# Patient Record
Sex: Male | Born: 1965
Health system: Southern US, Community
[De-identification: ages and names within clinical notes are randomized; demographics above are authoritative.]

## PROBLEM LIST (undated history)

## (undated) DIAGNOSIS — Z923 Personal history of irradiation: Secondary | ICD-10-CM

## (undated) DIAGNOSIS — D473 Essential (hemorrhagic) thrombocythemia: Secondary | ICD-10-CM

## (undated) DIAGNOSIS — D75839 Thrombocytosis, unspecified: Secondary | ICD-10-CM

## (undated) DIAGNOSIS — N529 Male erectile dysfunction, unspecified: Secondary | ICD-10-CM

## (undated) DIAGNOSIS — C61 Malignant neoplasm of prostate: Secondary | ICD-10-CM

## (undated) DIAGNOSIS — I10 Essential (primary) hypertension: Secondary | ICD-10-CM

## (undated) HISTORY — PX: COLONOSCOPY: SHX174

---

## 2007-03-13 ENCOUNTER — Ambulatory Visit: Payer: Self-pay | Admitting: Oncology

## 2007-03-13 LAB — CBC WITH DIFFERENTIAL/PLATELET
BASO%: 0.2 % (ref 0.0–2.0)
Eosinophils Absolute: 0.1 10*3/uL (ref 0.0–0.5)
MCHC: 35.8 g/dL (ref 32.0–35.9)
MCV: 86.1 fL (ref 81.6–98.0)
MONO#: 0.4 10*3/uL (ref 0.1–0.9)
MONO%: 5.1 % (ref 0.0–13.0)
NEUT#: 5.2 10*3/uL (ref 1.5–6.5)
RBC: 5.27 10*6/uL (ref 4.20–5.71)
RDW: 13.2 % (ref 11.2–14.6)
WBC: 7.1 10*3/uL (ref 4.0–10.0)

## 2007-03-13 LAB — ERYTHROCYTE SEDIMENTATION RATE: Sed Rate: 1 mm/hr (ref 0–20)

## 2007-03-13 LAB — CHCC SMEAR

## 2007-03-13 LAB — PROTHROMBIN TIME
INR: 1.1 (ref 0.0–1.5)
Prothrombin Time: 14.5 seconds (ref 11.6–15.2)

## 2007-03-13 LAB — APTT: aPTT: 33 seconds (ref 24–37)

## 2007-03-13 LAB — MORPHOLOGY: PLT EST: INCREASED

## 2007-03-15 ENCOUNTER — Encounter: Payer: Self-pay | Admitting: Oncology

## 2007-03-15 ENCOUNTER — Ambulatory Visit: Payer: Self-pay | Admitting: Oncology

## 2007-03-15 ENCOUNTER — Ambulatory Visit (HOSPITAL_COMMUNITY): Admission: RE | Admit: 2007-03-15 | Discharge: 2007-03-15 | Payer: Self-pay | Admitting: Oncology

## 2007-03-18 LAB — COMPREHENSIVE METABOLIC PANEL
ALT: 23 U/L (ref 0–53)
AST: 19 U/L (ref 0–37)
Alkaline Phosphatase: 32 U/L — ABNORMAL LOW (ref 39–117)
Creatinine, Ser: 1.06 mg/dL (ref 0.40–1.50)
Sodium: 142 mEq/L (ref 135–145)
Total Bilirubin: 0.5 mg/dL (ref 0.3–1.2)
Total Protein: 7 g/dL (ref 6.0–8.3)

## 2007-03-18 LAB — CBC WITH DIFFERENTIAL/PLATELET
Basophils Absolute: 0 10*3/uL (ref 0.0–0.1)
EOS%: 2.3 % (ref 0.0–7.0)
Eosinophils Absolute: 0.1 10*3/uL (ref 0.0–0.5)
HCT: 44 % (ref 38.7–49.9)
HGB: 15.8 g/dL (ref 13.0–17.1)
MCH: 30.4 pg (ref 28.0–33.4)
MCV: 85 fL (ref 81.6–98.0)
MONO%: 5.4 % (ref 0.0–13.0)
NEUT%: 68.5 % (ref 40.0–75.0)
lymph#: 1.4 10*3/uL (ref 0.9–3.3)

## 2007-03-18 LAB — JAK2 GENOTYPR

## 2007-03-18 LAB — LACTATE DEHYDROGENASE: LDH: 161 U/L (ref 94–250)

## 2007-03-18 LAB — C-REACTIVE PROTEIN: CRP: 0 mg/dL (ref <0.6)

## 2007-03-21 LAB — CBC WITH DIFFERENTIAL/PLATELET
Basophils Absolute: 0 10*3/uL (ref 0.0–0.1)
EOS%: 1.8 % (ref 0.0–7.0)
HGB: 16.1 g/dL (ref 13.0–17.1)
LYMPH%: 27.4 % (ref 14.0–48.0)
MCH: 30.8 pg (ref 28.0–33.4)
MCV: 86.5 fL (ref 81.6–98.0)
MONO%: 5.3 % (ref 0.0–13.0)
Platelets: 834 10*3/uL — ABNORMAL HIGH (ref 145–400)
RDW: 12.9 % (ref 11.2–14.6)

## 2007-03-26 LAB — CBC WITH DIFFERENTIAL/PLATELET
BASO%: 0.5 % (ref 0.0–2.0)
EOS%: 1.7 % (ref 0.0–7.0)
Eosinophils Absolute: 0.1 10*3/uL (ref 0.0–0.5)
LYMPH%: 22.7 % (ref 14.0–48.0)
MCH: 30.9 pg (ref 28.0–33.4)
MCHC: 35.5 g/dL (ref 32.0–35.9)
MCV: 87 fL (ref 81.6–98.0)
MONO%: 5.3 % (ref 0.0–13.0)
NEUT#: 4 10*3/uL (ref 1.5–6.5)
Platelets: 725 10*3/uL — ABNORMAL HIGH (ref 145–400)
RBC: 5.29 10*6/uL (ref 4.20–5.71)
RDW: 12.7 % (ref 11.2–14.6)

## 2007-04-02 LAB — CBC WITH DIFFERENTIAL/PLATELET
BASO%: 0.4 % (ref 0.0–2.0)
Eosinophils Absolute: 0.1 10*3/uL (ref 0.0–0.5)
LYMPH%: 26.2 % (ref 14.0–48.0)
MCHC: 36 g/dL — ABNORMAL HIGH (ref 32.0–35.9)
MONO#: 0.2 10*3/uL (ref 0.1–0.9)
NEUT#: 2.9 10*3/uL (ref 1.5–6.5)
RBC: 5.15 10*6/uL (ref 4.20–5.71)
RDW: 13.1 % (ref 11.2–14.6)
WBC: 4.4 10*3/uL (ref 4.0–10.0)
lymph#: 1.1 10*3/uL (ref 0.9–3.3)

## 2007-04-09 LAB — CBC WITH DIFFERENTIAL/PLATELET
BASO%: 0.5 % (ref 0.0–2.0)
Eosinophils Absolute: 0 10*3/uL (ref 0.0–0.5)
HCT: 44.4 % (ref 38.7–49.9)
LYMPH%: 26.1 % (ref 14.0–48.0)
MCHC: 36.2 g/dL — ABNORMAL HIGH (ref 32.0–35.9)
MONO#: 0.2 10*3/uL (ref 0.1–0.9)
NEUT#: 3.4 10*3/uL (ref 1.5–6.5)
NEUT%: 67.9 % (ref 40.0–75.0)
Platelets: 380 10*3/uL (ref 145–400)
RBC: 5 10*6/uL (ref 4.20–5.71)
WBC: 4.9 10*3/uL (ref 4.0–10.0)
lymph#: 1.3 10*3/uL (ref 0.9–3.3)

## 2007-04-09 LAB — MORPHOLOGY: PLT EST: ADEQUATE

## 2007-04-12 LAB — CBC WITH DIFFERENTIAL/PLATELET
Basophils Absolute: 0 10*3/uL (ref 0.0–0.1)
EOS%: 1.5 % (ref 0.0–7.0)
Eosinophils Absolute: 0.1 10*3/uL (ref 0.0–0.5)
HGB: 15.9 g/dL (ref 13.0–17.1)
LYMPH%: 30.8 % (ref 14.0–48.0)
MCH: 32 pg (ref 28.0–33.4)
MCV: 89.1 fL (ref 81.6–98.0)
MONO%: 6.5 % (ref 0.0–13.0)
NEUT#: 2.5 10*3/uL (ref 1.5–6.5)
Platelets: 349 10*3/uL (ref 145–400)
RBC: 4.97 10*6/uL (ref 4.20–5.71)

## 2007-04-19 LAB — CBC WITH DIFFERENTIAL/PLATELET
BASO%: 0.3 % (ref 0.0–2.0)
Basophils Absolute: 0 10*3/uL (ref 0.0–0.1)
EOS%: 1.2 % (ref 0.0–7.0)
Eosinophils Absolute: 0.1 10*3/uL (ref 0.0–0.5)
HCT: 42.4 % (ref 38.7–49.9)
HGB: 15.3 g/dL (ref 13.0–17.1)
LYMPH%: 19.2 % (ref 14.0–48.0)
MCH: 32.6 pg (ref 28.0–33.4)
MCHC: 36.1 g/dL — ABNORMAL HIGH (ref 32.0–35.9)
MCV: 90.5 fL (ref 81.6–98.0)
MONO#: 0.3 10*3/uL (ref 0.1–0.9)
MONO%: 6.9 % (ref 0.0–13.0)
NEUT#: 3.7 10*3/uL (ref 1.5–6.5)
NEUT%: 72.4 % (ref 40.0–75.0)
Platelets: 243 10*3/uL (ref 145–400)
RBC: 4.68 10*6/uL (ref 4.20–5.71)
RDW: 19.9 % — ABNORMAL HIGH (ref 11.2–14.6)
WBC: 5.1 10*3/uL (ref 4.0–10.0)
lymph#: 1 10*3/uL (ref 0.9–3.3)

## 2007-04-23 LAB — CBC WITH DIFFERENTIAL/PLATELET
Basophils Absolute: 0 10*3/uL (ref 0.0–0.1)
EOS%: 1.5 % (ref 0.0–7.0)
Eosinophils Absolute: 0.1 10*3/uL (ref 0.0–0.5)
HCT: 43 % (ref 38.7–49.9)
HGB: 15.5 g/dL (ref 13.0–17.1)
MCH: 33 pg (ref 28.0–33.4)
MCV: 91.5 fL (ref 81.6–98.0)
MONO%: 5.1 % (ref 0.0–13.0)
NEUT#: 2.9 10*3/uL (ref 1.5–6.5)
NEUT%: 65.8 % (ref 40.0–75.0)
WBC: 4.4 10*3/uL (ref 4.0–10.0)

## 2007-04-23 LAB — MORPHOLOGY: PLT EST: ADEQUATE

## 2007-04-26 ENCOUNTER — Ambulatory Visit: Payer: Self-pay | Admitting: Oncology

## 2007-04-30 LAB — CBC WITH DIFFERENTIAL/PLATELET
EOS%: 1.7 % (ref 0.0–7.0)
Eosinophils Absolute: 0.1 10*3/uL (ref 0.0–0.5)
LYMPH%: 31.7 % (ref 14.0–48.0)
MCH: 33.2 pg (ref 28.0–33.4)
MCV: 93.2 fL (ref 81.6–98.0)
MONO%: 4.6 % (ref 0.0–13.0)
NEUT#: 2.3 10*3/uL (ref 1.5–6.5)
Platelets: 219 10*3/uL (ref 145–400)
RBC: 4.38 10*6/uL (ref 4.20–5.71)
RDW: 23.9 % — ABNORMAL HIGH (ref 11.2–14.6)

## 2007-04-30 LAB — MORPHOLOGY

## 2007-05-07 LAB — CBC WITH DIFFERENTIAL/PLATELET
Basophils Absolute: 0 10*3/uL (ref 0.0–0.1)
EOS%: 1.8 % (ref 0.0–7.0)
Eosinophils Absolute: 0.1 10*3/uL (ref 0.0–0.5)
HGB: 15.4 g/dL (ref 13.0–17.1)
LYMPH%: 30.3 % (ref 14.0–48.0)
MCH: 34.5 pg — ABNORMAL HIGH (ref 28.0–33.4)
MCV: 94.7 fL (ref 81.6–98.0)
MONO%: 7.2 % (ref 0.0–13.0)
NEUT#: 2.1 10*3/uL (ref 1.5–6.5)
Platelets: 185 10*3/uL (ref 145–400)

## 2007-05-07 LAB — MORPHOLOGY: PLT EST: ADEQUATE

## 2007-05-14 LAB — CBC WITH DIFFERENTIAL/PLATELET
BASO%: 0.7 % (ref 0.0–2.0)
EOS%: 1.3 % (ref 0.0–7.0)
MCHC: 36.5 g/dL — ABNORMAL HIGH (ref 32.0–35.9)
MONO#: 0.3 10*3/uL (ref 0.1–0.9)
RBC: 4.35 10*6/uL (ref 4.20–5.71)
RDW: 25.3 % — ABNORMAL HIGH (ref 11.2–14.6)
WBC: 3.8 10*3/uL — ABNORMAL LOW (ref 4.0–10.0)
lymph#: 1.2 10*3/uL (ref 0.9–3.3)

## 2007-05-14 LAB — MORPHOLOGY: PLT EST: ADEQUATE

## 2007-05-21 LAB — CBC WITH DIFFERENTIAL/PLATELET
BASO%: 0.5 % (ref 0.0–2.0)
HCT: 40.1 % (ref 38.7–49.9)
MCHC: 36.1 g/dL — ABNORMAL HIGH (ref 32.0–35.9)
MONO#: 0.3 10*3/uL (ref 0.1–0.9)
NEUT%: 60.8 % (ref 40.0–75.0)
RBC: 4.08 10*6/uL — ABNORMAL LOW (ref 4.20–5.71)
RDW: 25.6 % — ABNORMAL HIGH (ref 11.2–14.6)
WBC: 3.1 10*3/uL — ABNORMAL LOW (ref 4.0–10.0)
lymph#: 0.9 10*3/uL (ref 0.9–3.3)

## 2007-06-04 ENCOUNTER — Ambulatory Visit: Payer: Self-pay | Admitting: Oncology

## 2007-06-04 LAB — CBC WITH DIFFERENTIAL/PLATELET
Basophils Absolute: 0 10*3/uL (ref 0.0–0.1)
Eosinophils Absolute: 0 10*3/uL (ref 0.0–0.5)
HCT: 41.9 % (ref 38.7–49.9)
HGB: 15.1 g/dL (ref 13.0–17.1)
MONO#: 0.2 10*3/uL (ref 0.1–0.9)
NEUT#: 2 10*3/uL (ref 1.5–6.5)
NEUT%: 60.2 % (ref 40.0–75.0)
WBC: 3.4 10*3/uL — ABNORMAL LOW (ref 4.0–10.0)
lymph#: 1.1 10*3/uL (ref 0.9–3.3)

## 2007-06-18 LAB — CBC WITH DIFFERENTIAL/PLATELET
Basophils Absolute: 0 10*3/uL (ref 0.0–0.1)
EOS%: 1.1 % (ref 0.0–7.0)
Eosinophils Absolute: 0 10*3/uL (ref 0.0–0.5)
HCT: 43.4 % (ref 38.7–49.9)
HGB: 15.6 g/dL (ref 13.0–17.1)
MCH: 38 pg — ABNORMAL HIGH (ref 28.0–33.4)
MCV: 105.8 fL — ABNORMAL HIGH (ref 81.6–98.0)
MONO%: 6.6 % (ref 0.0–13.0)
NEUT#: 2.1 10*3/uL (ref 1.5–6.5)
NEUT%: 58.5 % (ref 40.0–75.0)

## 2007-07-02 LAB — CBC WITH DIFFERENTIAL/PLATELET
Basophils Absolute: 0 10*3/uL (ref 0.0–0.1)
EOS%: 0.8 % (ref 0.0–7.0)
Eosinophils Absolute: 0 10*3/uL (ref 0.0–0.5)
HGB: 14.6 g/dL (ref 13.0–17.1)
LYMPH%: 32.3 % (ref 14.0–48.0)
MCH: 39 pg — ABNORMAL HIGH (ref 28.0–33.4)
MCV: 108.4 fL — ABNORMAL HIGH (ref 81.6–98.0)
MONO%: 6 % (ref 0.0–13.0)
Platelets: 184 10*3/uL (ref 145–400)
RBC: 3.73 10*6/uL — ABNORMAL LOW (ref 4.20–5.71)
RDW: 15.1 % — ABNORMAL HIGH (ref 11.2–14.6)

## 2007-07-16 LAB — CBC WITH DIFFERENTIAL/PLATELET
Basophils Absolute: 0 10*3/uL (ref 0.0–0.1)
EOS%: 0.8 % (ref 0.0–7.0)
HCT: 39.6 % (ref 38.7–49.9)
HGB: 14.4 g/dL (ref 13.0–17.1)
LYMPH%: 35 % (ref 14.0–48.0)
MCH: 40.6 pg — ABNORMAL HIGH (ref 28.0–33.4)
MCV: 111.2 fL — ABNORMAL HIGH (ref 81.6–98.0)
MONO%: 8.1 % (ref 0.0–13.0)
NEUT%: 55.8 % (ref 40.0–75.0)
Platelets: 192 10*3/uL (ref 145–400)
RDW: 14 % (ref 11.2–14.6)

## 2007-07-25 ENCOUNTER — Ambulatory Visit: Payer: Self-pay | Admitting: Oncology

## 2007-07-30 LAB — CBC WITH DIFFERENTIAL/PLATELET
EOS%: 1.4 % (ref 0.0–7.0)
LYMPH%: 34.8 % (ref 14.0–48.0)
MCH: 39.8 pg — ABNORMAL HIGH (ref 28.0–33.4)
MCV: 112.1 fL — ABNORMAL HIGH (ref 81.6–98.0)
MONO%: 7.2 % (ref 0.0–13.0)
RBC: 3.79 10*6/uL — ABNORMAL LOW (ref 4.20–5.71)
RDW: 13.1 % (ref 11.2–14.6)

## 2007-08-23 LAB — CBC WITH DIFFERENTIAL/PLATELET
Basophils Absolute: 0.1 10*3/uL (ref 0.0–0.1)
EOS%: 0.3 % (ref 0.0–7.0)
HCT: 41.5 % (ref 38.7–49.9)
HGB: 15.3 g/dL (ref 13.0–17.1)
MCH: 40.8 pg — ABNORMAL HIGH (ref 28.0–33.4)
NEUT%: 63 % (ref 40.0–75.0)
lymph#: 1.1 10*3/uL (ref 0.9–3.3)

## 2007-09-18 ENCOUNTER — Ambulatory Visit: Payer: Self-pay | Admitting: Oncology

## 2007-09-20 LAB — CBC WITH DIFFERENTIAL/PLATELET
BASO%: 0.3 % (ref 0.0–2.0)
Basophils Absolute: 0 10*3/uL (ref 0.0–0.1)
HCT: 41.1 % (ref 38.7–49.9)
HGB: 14.7 g/dL (ref 13.0–17.1)
MCHC: 35.8 g/dL (ref 32.0–35.9)
MONO#: 0.3 10*3/uL (ref 0.1–0.9)
NEUT%: 58.4 % (ref 40.0–75.0)
WBC: 3.9 10*3/uL — ABNORMAL LOW (ref 4.0–10.0)
lymph#: 1.3 10*3/uL (ref 0.9–3.3)

## 2007-10-15 LAB — CBC WITH DIFFERENTIAL/PLATELET
BASO%: 0.3 % (ref 0.0–2.0)
Eosinophils Absolute: 0 10*3/uL (ref 0.0–0.5)
HCT: 40.2 % (ref 38.7–49.9)
LYMPH%: 35.6 % (ref 14.0–48.0)
MCHC: 36.3 g/dL — ABNORMAL HIGH (ref 32.0–35.9)
MONO#: 0.2 10*3/uL (ref 0.1–0.9)
NEUT#: 1.7 10*3/uL (ref 1.5–6.5)
Platelets: 192 10*3/uL (ref 145–400)
RBC: 3.63 10*6/uL — ABNORMAL LOW (ref 4.20–5.71)
WBC: 3.1 10*3/uL — ABNORMAL LOW (ref 4.0–10.0)
lymph#: 1.1 10*3/uL (ref 0.9–3.3)

## 2007-11-08 ENCOUNTER — Ambulatory Visit: Payer: Self-pay | Admitting: Oncology

## 2007-11-12 LAB — CBC WITH DIFFERENTIAL/PLATELET
Basophils Absolute: 0 10*3/uL (ref 0.0–0.1)
Eosinophils Absolute: 0 10*3/uL (ref 0.0–0.5)
HCT: 41.2 % (ref 38.7–49.9)
HGB: 14.8 g/dL (ref 13.0–17.1)
LYMPH%: 31.4 % (ref 14.0–48.0)
MONO#: 0.2 10*3/uL (ref 0.1–0.9)
NEUT#: 2.3 10*3/uL (ref 1.5–6.5)
NEUT%: 63.2 % (ref 40.0–75.0)
Platelets: 188 10*3/uL (ref 145–400)
WBC: 3.6 10*3/uL — ABNORMAL LOW (ref 4.0–10.0)
lymph#: 1.1 10*3/uL (ref 0.9–3.3)

## 2007-11-12 LAB — COMPREHENSIVE METABOLIC PANEL
CO2: 24 mEq/L (ref 19–32)
Calcium: 9.5 mg/dL (ref 8.4–10.5)
Chloride: 104 mEq/L (ref 96–112)
Creatinine, Ser: 1.03 mg/dL (ref 0.40–1.50)
Glucose, Bld: 109 mg/dL — ABNORMAL HIGH (ref 70–99)
Total Bilirubin: 0.7 mg/dL (ref 0.3–1.2)

## 2007-12-19 ENCOUNTER — Ambulatory Visit: Payer: Self-pay | Admitting: Oncology

## 2007-12-19 LAB — CBC WITH DIFFERENTIAL/PLATELET
Eosinophils Absolute: 0 10*3/uL (ref 0.0–0.5)
LYMPH%: 37.3 % (ref 14.0–48.0)
MONO#: 0.4 10*3/uL (ref 0.1–0.9)
NEUT#: 1.9 10*3/uL (ref 1.5–6.5)
Platelets: 272 10*3/uL (ref 145–400)
RBC: 3.97 10*6/uL — ABNORMAL LOW (ref 4.20–5.71)
RDW: 12.1 % (ref 11.2–14.6)
WBC: 3.7 10*3/uL — ABNORMAL LOW (ref 4.0–10.0)
lymph#: 1.4 10*3/uL (ref 0.9–3.3)

## 2008-02-05 ENCOUNTER — Ambulatory Visit: Payer: Self-pay | Admitting: Oncology

## 2008-02-11 LAB — CBC WITH DIFFERENTIAL/PLATELET
BASO%: 0.3 % (ref 0.0–2.0)
Eosinophils Absolute: 0 10*3/uL (ref 0.0–0.5)
MCV: 106.2 fL — ABNORMAL HIGH (ref 81.6–98.0)
MONO%: 9.8 % (ref 0.0–13.0)
NEUT#: 2.2 10*3/uL (ref 1.5–6.5)
RBC: 4 10*6/uL — ABNORMAL LOW (ref 4.20–5.71)
RDW: 12.5 % (ref 11.2–14.6)
WBC: 3.5 10*3/uL — ABNORMAL LOW (ref 4.0–10.0)

## 2008-03-10 LAB — CBC WITH DIFFERENTIAL/PLATELET
EOS%: 1.5 % (ref 0.0–7.0)
MCH: 37.6 pg — ABNORMAL HIGH (ref 28.0–33.4)
MCV: 105.8 fL — ABNORMAL HIGH (ref 81.6–98.0)
MONO%: 7.6 % (ref 0.0–13.0)
RBC: 4.1 10*6/uL — ABNORMAL LOW (ref 4.20–5.71)
RDW: 12.9 % (ref 11.2–14.6)

## 2008-04-02 ENCOUNTER — Ambulatory Visit: Payer: Self-pay | Admitting: Oncology

## 2008-04-07 LAB — CBC WITH DIFFERENTIAL/PLATELET
Eosinophils Absolute: 0 10*3/uL (ref 0.0–0.5)
LYMPH%: 30.5 % (ref 14.0–48.0)
MCHC: 35.3 g/dL (ref 32.0–35.9)
MCV: 106 fL — ABNORMAL HIGH (ref 81.6–98.0)
MONO%: 6.4 % (ref 0.0–13.0)
NEUT#: 2.5 10*3/uL (ref 1.5–6.5)
Platelets: 230 10*3/uL (ref 145–400)
RBC: 4.23 10*6/uL (ref 4.20–5.71)

## 2008-05-05 LAB — CBC WITH DIFFERENTIAL/PLATELET
BASO%: 0.3 % (ref 0.0–2.0)
Basophils Absolute: 0 10*3/uL (ref 0.0–0.1)
EOS%: 0.9 % (ref 0.0–7.0)
Eosinophils Absolute: 0 10*3/uL (ref 0.0–0.5)
HCT: 40.9 % (ref 38.7–49.9)
HGB: 14.5 g/dL (ref 13.0–17.1)
LYMPH%: 31.3 % (ref 14.0–48.0)
MCH: 36.6 pg — ABNORMAL HIGH (ref 28.0–33.4)
MCHC: 35.5 g/dL (ref 32.0–35.9)
MCV: 103.2 fL — ABNORMAL HIGH (ref 81.6–98.0)
MONO#: 0.2 10*3/uL (ref 0.1–0.9)
MONO%: 6.5 % (ref 0.0–13.0)
NEUT#: 2.3 10*3/uL (ref 1.5–6.5)
NEUT%: 61 % (ref 40.0–75.0)
Platelets: 410 10*3/uL — ABNORMAL HIGH (ref 145–400)
RBC: 3.96 10*6/uL — ABNORMAL LOW (ref 4.20–5.71)
RDW: 12.3 % (ref 11.2–14.6)
WBC: 3.8 10*3/uL — ABNORMAL LOW (ref 4.0–10.0)
lymph#: 1.2 10*3/uL (ref 0.9–3.3)

## 2008-05-27 ENCOUNTER — Ambulatory Visit: Payer: Self-pay | Admitting: Oncology

## 2008-06-02 LAB — CBC WITH DIFFERENTIAL/PLATELET
Basophils Absolute: 0 10*3/uL (ref 0.0–0.1)
Eosinophils Absolute: 0.1 10*3/uL (ref 0.0–0.5)
HCT: 43.9 % (ref 38.7–49.9)
HGB: 15.2 g/dL (ref 13.0–17.1)
LYMPH%: 27.5 % (ref 14.0–48.0)
MCV: 103.6 fL — ABNORMAL HIGH (ref 81.6–98.0)
MONO%: 5.8 % (ref 0.0–13.0)
NEUT#: 2.8 10*3/uL (ref 1.5–6.5)
NEUT%: 64.8 % (ref 40.0–75.0)
Platelets: 231 10*3/uL (ref 145–400)

## 2008-07-24 ENCOUNTER — Ambulatory Visit: Payer: Self-pay | Admitting: Oncology

## 2008-07-28 LAB — CBC WITH DIFFERENTIAL/PLATELET
BASO%: 0.3 % (ref 0.0–2.0)
Basophils Absolute: 0 10*3/uL (ref 0.0–0.1)
EOS%: 1.6 % (ref 0.0–7.0)
HGB: 15.8 g/dL (ref 13.0–17.1)
MCH: 36.3 pg — ABNORMAL HIGH (ref 27.2–33.4)
MCHC: 35.3 g/dL (ref 32.0–36.0)
MONO#: 0.3 10*3/uL (ref 0.1–0.9)
RDW: 13.4 % (ref 11.0–14.6)
WBC: 4.4 10*3/uL (ref 4.0–10.3)
lymph#: 1.3 10*3/uL (ref 0.9–3.3)

## 2008-08-25 LAB — CBC WITH DIFFERENTIAL/PLATELET
BASO%: 0.3 % (ref 0.0–2.0)
EOS%: 1.3 % (ref 0.0–7.0)
HCT: 44.2 % (ref 38.4–49.9)
MCH: 36.3 pg — ABNORMAL HIGH (ref 27.2–33.4)
MCHC: 35.2 g/dL (ref 32.0–36.0)
MONO#: 0.3 10*3/uL (ref 0.1–0.9)
NEUT%: 63 % (ref 39.0–75.0)
RBC: 4.28 10*6/uL (ref 4.20–5.82)
RDW: 14 % (ref 11.0–14.6)
WBC: 3.9 10*3/uL — ABNORMAL LOW (ref 4.0–10.3)
lymph#: 1.1 10*3/uL (ref 0.9–3.3)

## 2008-09-18 ENCOUNTER — Ambulatory Visit: Payer: Self-pay | Admitting: Oncology

## 2008-09-25 LAB — CBC WITH DIFFERENTIAL/PLATELET
Basophils Absolute: 0 10*3/uL (ref 0.0–0.1)
EOS%: 1.3 % (ref 0.0–7.0)
HGB: 16.1 g/dL (ref 13.0–17.1)
MCH: 37.3 pg — ABNORMAL HIGH (ref 27.2–33.4)
MCHC: 35.5 g/dL (ref 32.0–36.0)
MCV: 104.8 fL — ABNORMAL HIGH (ref 79.3–98.0)
MONO%: 6.8 % (ref 0.0–14.0)
RDW: 14 % (ref 11.0–14.6)

## 2008-10-20 LAB — CBC WITH DIFFERENTIAL/PLATELET
Basophils Absolute: 0 10*3/uL (ref 0.0–0.1)
EOS%: 1.5 % (ref 0.0–7.0)
HCT: 43.4 % (ref 38.4–49.9)
HGB: 15.8 g/dL (ref 13.0–17.1)
LYMPH%: 32.2 % (ref 14.0–49.0)
MCH: 36.2 pg — ABNORMAL HIGH (ref 27.2–33.4)
MCV: 99.3 fL — ABNORMAL HIGH (ref 79.3–98.0)
MONO%: 5.9 % (ref 0.0–14.0)
NEUT%: 59.9 % (ref 39.0–75.0)
Platelets: 191 10*3/uL (ref 140–400)
lymph#: 1.3 10*3/uL (ref 0.9–3.3)

## 2008-11-09 ENCOUNTER — Ambulatory Visit: Payer: Self-pay | Admitting: Oncology

## 2008-11-11 LAB — CBC WITH DIFFERENTIAL/PLATELET
BASO%: 0.3 % (ref 0.0–2.0)
Basophils Absolute: 0 10*3/uL (ref 0.0–0.1)
EOS%: 0.6 % (ref 0.0–7.0)
HCT: 44.8 % (ref 38.4–49.9)
HGB: 16.2 g/dL (ref 13.0–17.1)
MCH: 38.4 pg — ABNORMAL HIGH (ref 27.2–33.4)
MCHC: 36.2 g/dL — ABNORMAL HIGH (ref 32.0–36.0)
MCV: 106 fL — ABNORMAL HIGH (ref 79.3–98.0)
MONO%: 7.3 % (ref 0.0–14.0)
NEUT%: 66 % (ref 39.0–75.0)
RDW: 13.1 % (ref 11.0–14.6)

## 2008-11-11 LAB — COMPREHENSIVE METABOLIC PANEL
ALT: 20 U/L (ref 0–53)
AST: 18 U/L (ref 0–37)
Alkaline Phosphatase: 28 U/L — ABNORMAL LOW (ref 39–117)
BUN: 11 mg/dL (ref 6–23)
Creatinine, Ser: 0.97 mg/dL (ref 0.40–1.50)
Total Bilirubin: 1.1 mg/dL (ref 0.3–1.2)

## 2008-12-25 ENCOUNTER — Ambulatory Visit: Payer: Self-pay | Admitting: Oncology

## 2009-01-13 LAB — CBC WITH DIFFERENTIAL/PLATELET
BASO%: 0.3 % (ref 0.0–2.0)
HCT: 44.4 % (ref 38.4–49.9)
LYMPH%: 21.4 % (ref 14.0–49.0)
MCHC: 34.9 g/dL (ref 32.0–36.0)
MCV: 104.3 fL — ABNORMAL HIGH (ref 79.3–98.0)
MONO#: 0.3 10*3/uL (ref 0.1–0.9)
MONO%: 5.7 % (ref 0.0–14.0)
NEUT%: 71.9 % (ref 39.0–75.0)
Platelets: 283 10*3/uL (ref 140–400)
RBC: 4.26 10*6/uL (ref 4.20–5.82)
WBC: 5.7 10*3/uL (ref 4.0–10.3)

## 2009-01-22 ENCOUNTER — Ambulatory Visit: Payer: Self-pay | Admitting: Oncology

## 2009-01-27 ENCOUNTER — Ambulatory Visit (HOSPITAL_COMMUNITY): Admission: RE | Admit: 2009-01-27 | Discharge: 2009-01-27 | Payer: Self-pay | Admitting: Oncology

## 2009-02-17 HISTORY — PX: PROSTATE BIOPSY: SHX241

## 2009-02-22 ENCOUNTER — Ambulatory Visit: Payer: Self-pay | Admitting: Oncology

## 2009-02-24 LAB — CBC WITH DIFFERENTIAL/PLATELET
Basophils Absolute: 0 10*3/uL (ref 0.0–0.1)
Eosinophils Absolute: 0 10*3/uL (ref 0.0–0.5)
HCT: 43 % (ref 38.4–49.9)
HGB: 15.3 g/dL (ref 13.0–17.1)
LYMPH%: 17 % (ref 14.0–49.0)
MCV: 102.6 fL — ABNORMAL HIGH (ref 79.3–98.0)
MONO#: 0.2 10*3/uL (ref 0.1–0.9)
MONO%: 4.5 % (ref 0.0–14.0)
NEUT#: 4.3 10*3/uL (ref 1.5–6.5)
NEUT%: 78.1 % — ABNORMAL HIGH (ref 39.0–75.0)
Platelets: 286 10*3/uL (ref 140–400)
RBC: 4.19 10*6/uL — ABNORMAL LOW (ref 4.20–5.82)
WBC: 5.5 10*3/uL (ref 4.0–10.3)

## 2009-04-16 ENCOUNTER — Encounter (INDEPENDENT_AMBULATORY_CARE_PROVIDER_SITE_OTHER): Payer: Self-pay | Admitting: Urology

## 2009-04-16 ENCOUNTER — Inpatient Hospital Stay (HOSPITAL_COMMUNITY): Admission: RE | Admit: 2009-04-16 | Discharge: 2009-04-17 | Payer: Self-pay | Admitting: Urology

## 2009-04-16 HISTORY — PX: PROSTATECTOMY: SHX69

## 2009-04-19 ENCOUNTER — Ambulatory Visit: Payer: Self-pay | Admitting: Oncology

## 2009-05-21 ENCOUNTER — Ambulatory Visit: Payer: Self-pay | Admitting: Oncology

## 2009-05-25 LAB — CBC WITH DIFFERENTIAL/PLATELET
Basophils Absolute: 0 10*3/uL (ref 0.0–0.1)
Eosinophils Absolute: 0 10*3/uL (ref 0.0–0.5)
HGB: 16.1 g/dL (ref 13.0–17.1)
MCV: 101.9 fL — ABNORMAL HIGH (ref 79.3–98.0)
MONO#: 0.3 10*3/uL (ref 0.1–0.9)
MONO%: 6.2 % (ref 0.0–14.0)
NEUT#: 3.2 10*3/uL (ref 1.5–6.5)
RBC: 4.47 10*6/uL (ref 4.20–5.82)
RDW: 13.3 % (ref 11.0–14.6)
WBC: 4.7 10*3/uL (ref 4.0–10.3)
lymph#: 1.1 10*3/uL (ref 0.9–3.3)

## 2009-06-14 ENCOUNTER — Ambulatory Visit: Payer: Self-pay | Admitting: Oncology

## 2009-07-28 ENCOUNTER — Ambulatory Visit: Admission: RE | Admit: 2009-07-28 | Discharge: 2009-10-07 | Payer: Self-pay | Admitting: Radiation Oncology

## 2009-08-11 ENCOUNTER — Ambulatory Visit: Payer: Self-pay | Admitting: Oncology

## 2009-08-11 LAB — CBC WITH DIFFERENTIAL/PLATELET
Eosinophils Absolute: 0.1 10*3/uL (ref 0.0–0.5)
HGB: 16.8 g/dL (ref 13.0–17.1)
LYMPH%: 20.7 % (ref 14.0–49.0)
MONO#: 0.5 10*3/uL (ref 0.1–0.9)
NEUT#: 3.7 10*3/uL (ref 1.5–6.5)
Platelets: 191 10*3/uL (ref 140–400)
RBC: 4.78 10*6/uL (ref 4.20–5.82)
RDW: 13.6 % (ref 11.0–14.6)
WBC: 5.5 10*3/uL (ref 4.0–10.3)

## 2009-10-05 ENCOUNTER — Ambulatory Visit: Payer: Self-pay | Admitting: Oncology

## 2009-10-05 LAB — CBC WITH DIFFERENTIAL/PLATELET
Eosinophils Absolute: 0.1 10*3/uL (ref 0.0–0.5)
MCV: 94.9 fL (ref 79.3–98.0)
MONO%: 7.4 % (ref 0.0–14.0)
NEUT#: 3.2 10*3/uL (ref 1.5–6.5)
RBC: 4.6 10*6/uL (ref 4.20–5.82)
RDW: 12.3 % (ref 11.0–14.6)
WBC: 4 10*3/uL (ref 4.0–10.3)
lymph#: 0.4 10*3/uL — ABNORMAL LOW (ref 0.9–3.3)

## 2009-11-04 ENCOUNTER — Ambulatory Visit: Payer: Self-pay | Admitting: Oncology

## 2009-11-04 LAB — CBC WITH DIFFERENTIAL/PLATELET
BASO%: 0.3 % (ref 0.0–2.0)
Basophils Absolute: 0 10*3/uL (ref 0.0–0.1)
EOS%: 1.3 % (ref 0.0–7.0)
MCH: 32.9 pg (ref 27.2–33.4)
MCHC: 35 g/dL (ref 32.0–36.0)
MCV: 94 fL (ref 79.3–98.0)
MONO%: 8.1 % (ref 0.0–14.0)
RBC: 4.77 10*6/uL (ref 4.20–5.82)
RDW: 14.2 % (ref 11.0–14.6)
lymph#: 0.5 10*3/uL — ABNORMAL LOW (ref 0.9–3.3)

## 2010-02-10 ENCOUNTER — Ambulatory Visit: Payer: Self-pay | Admitting: Oncology

## 2010-02-15 LAB — CBC WITH DIFFERENTIAL/PLATELET
BASO%: 0.4 % (ref 0.0–2.0)
Basophils Absolute: 0 10*3/uL (ref 0.0–0.1)
EOS%: 0.7 % (ref 0.0–7.0)
HCT: 44 % (ref 38.4–49.9)
HGB: 15.5 g/dL (ref 13.0–17.1)
MCH: 36 pg — ABNORMAL HIGH (ref 27.2–33.4)
MCHC: 35.2 g/dL (ref 32.0–36.0)
MCV: 102.3 fL — ABNORMAL HIGH (ref 79.3–98.0)
MONO%: 6.5 % (ref 0.0–14.0)
NEUT%: 79.3 % — ABNORMAL HIGH (ref 39.0–75.0)

## 2010-05-16 ENCOUNTER — Ambulatory Visit: Payer: Self-pay | Admitting: Oncology

## 2010-06-01 LAB — CBC WITH DIFFERENTIAL/PLATELET
Eosinophils Absolute: 0 10*3/uL (ref 0.0–0.5)
HCT: 42.9 % (ref 38.4–49.9)
LYMPH%: 18.5 % (ref 14.0–49.0)
MONO#: 0.2 10*3/uL (ref 0.1–0.9)
NEUT#: 2.9 10*3/uL (ref 1.5–6.5)
NEUT%: 75 % (ref 39.0–75.0)
Platelets: 216 10*3/uL (ref 140–400)
WBC: 3.8 10*3/uL — ABNORMAL LOW (ref 4.0–10.3)

## 2010-08-16 ENCOUNTER — Other Ambulatory Visit: Payer: Self-pay | Admitting: Oncology

## 2010-08-16 ENCOUNTER — Encounter (HOSPITAL_BASED_OUTPATIENT_CLINIC_OR_DEPARTMENT_OTHER): Payer: BC Managed Care – PPO | Admitting: Oncology

## 2010-08-16 DIAGNOSIS — C61 Malignant neoplasm of prostate: Secondary | ICD-10-CM

## 2010-08-16 DIAGNOSIS — D473 Essential (hemorrhagic) thrombocythemia: Secondary | ICD-10-CM

## 2010-08-16 LAB — CBC WITH DIFFERENTIAL/PLATELET
BASO%: 0.5 % (ref 0.0–2.0)
EOS%: 1.8 % (ref 0.0–7.0)
HCT: 44.7 % (ref 38.4–49.9)
LYMPH%: 17.6 % (ref 14.0–49.0)
MCH: 35.7 pg — ABNORMAL HIGH (ref 27.2–33.4)
MCHC: 35.1 g/dL (ref 32.0–36.0)
MCV: 101.9 fL — ABNORMAL HIGH (ref 79.3–98.0)
MONO#: 0.3 10*3/uL (ref 0.1–0.9)
NEUT%: 73.1 % (ref 39.0–75.0)
Platelets: 287 10*3/uL (ref 140–400)

## 2010-09-07 LAB — URINALYSIS, ROUTINE W REFLEX MICROSCOPIC
Bilirubin Urine: NEGATIVE
Ketones, ur: NEGATIVE mg/dL
Nitrite: NEGATIVE
pH: 7.5 (ref 5.0–8.0)

## 2010-09-07 LAB — COMPREHENSIVE METABOLIC PANEL
ALT: 60 U/L — ABNORMAL HIGH (ref 0–53)
BUN: 11 mg/dL (ref 6–23)
Calcium: 9.7 mg/dL (ref 8.4–10.5)
Creatinine, Ser: 0.93 mg/dL (ref 0.4–1.5)
Glucose, Bld: 89 mg/dL (ref 70–99)
Sodium: 139 mEq/L (ref 135–145)
Total Protein: 7.1 g/dL (ref 6.0–8.3)

## 2010-09-07 LAB — CBC
Hemoglobin: 16 g/dL (ref 13.0–17.0)
MCHC: 34.8 g/dL (ref 30.0–36.0)
MCHC: 35.4 g/dL (ref 30.0–36.0)
MCV: 104.8 fL — ABNORMAL HIGH (ref 78.0–100.0)
RDW: 12.1 % (ref 11.5–15.5)
RDW: 13.2 % (ref 11.5–15.5)

## 2010-09-07 LAB — BASIC METABOLIC PANEL
BUN: 6 mg/dL (ref 6–23)
CO2: 29 mEq/L (ref 19–32)
Calcium: 8.7 mg/dL (ref 8.4–10.5)
Creatinine, Ser: 0.87 mg/dL (ref 0.4–1.5)
Glucose, Bld: 134 mg/dL — ABNORMAL HIGH (ref 70–99)

## 2010-09-07 LAB — DIFFERENTIAL
Basophils Absolute: 0 10*3/uL (ref 0.0–0.1)
Basophils Relative: 0 % (ref 0–1)
Eosinophils Absolute: 0 10*3/uL (ref 0.0–0.7)
Neutro Abs: 7 10*3/uL (ref 1.7–7.7)
Neutrophils Relative %: 83 % — ABNORMAL HIGH (ref 43–77)

## 2010-09-07 LAB — TYPE AND SCREEN
ABO/RH(D): A POS
Antibody Screen: NEGATIVE

## 2010-09-07 LAB — PROTIME-INR
INR: 0.99 (ref 0.00–1.49)
Prothrombin Time: 13 seconds (ref 11.6–15.2)

## 2010-09-07 LAB — APTT: aPTT: 31 seconds (ref 24–37)

## 2010-10-05 IMAGING — CR DG CHEST 2V
2 series · 2 of 2 positions shown · non-contrast
Comparison: None

CLINICAL DATA: Prostate carcinoma.  Preop workup.

CHEST - 2 VIEW

[w chest pa]
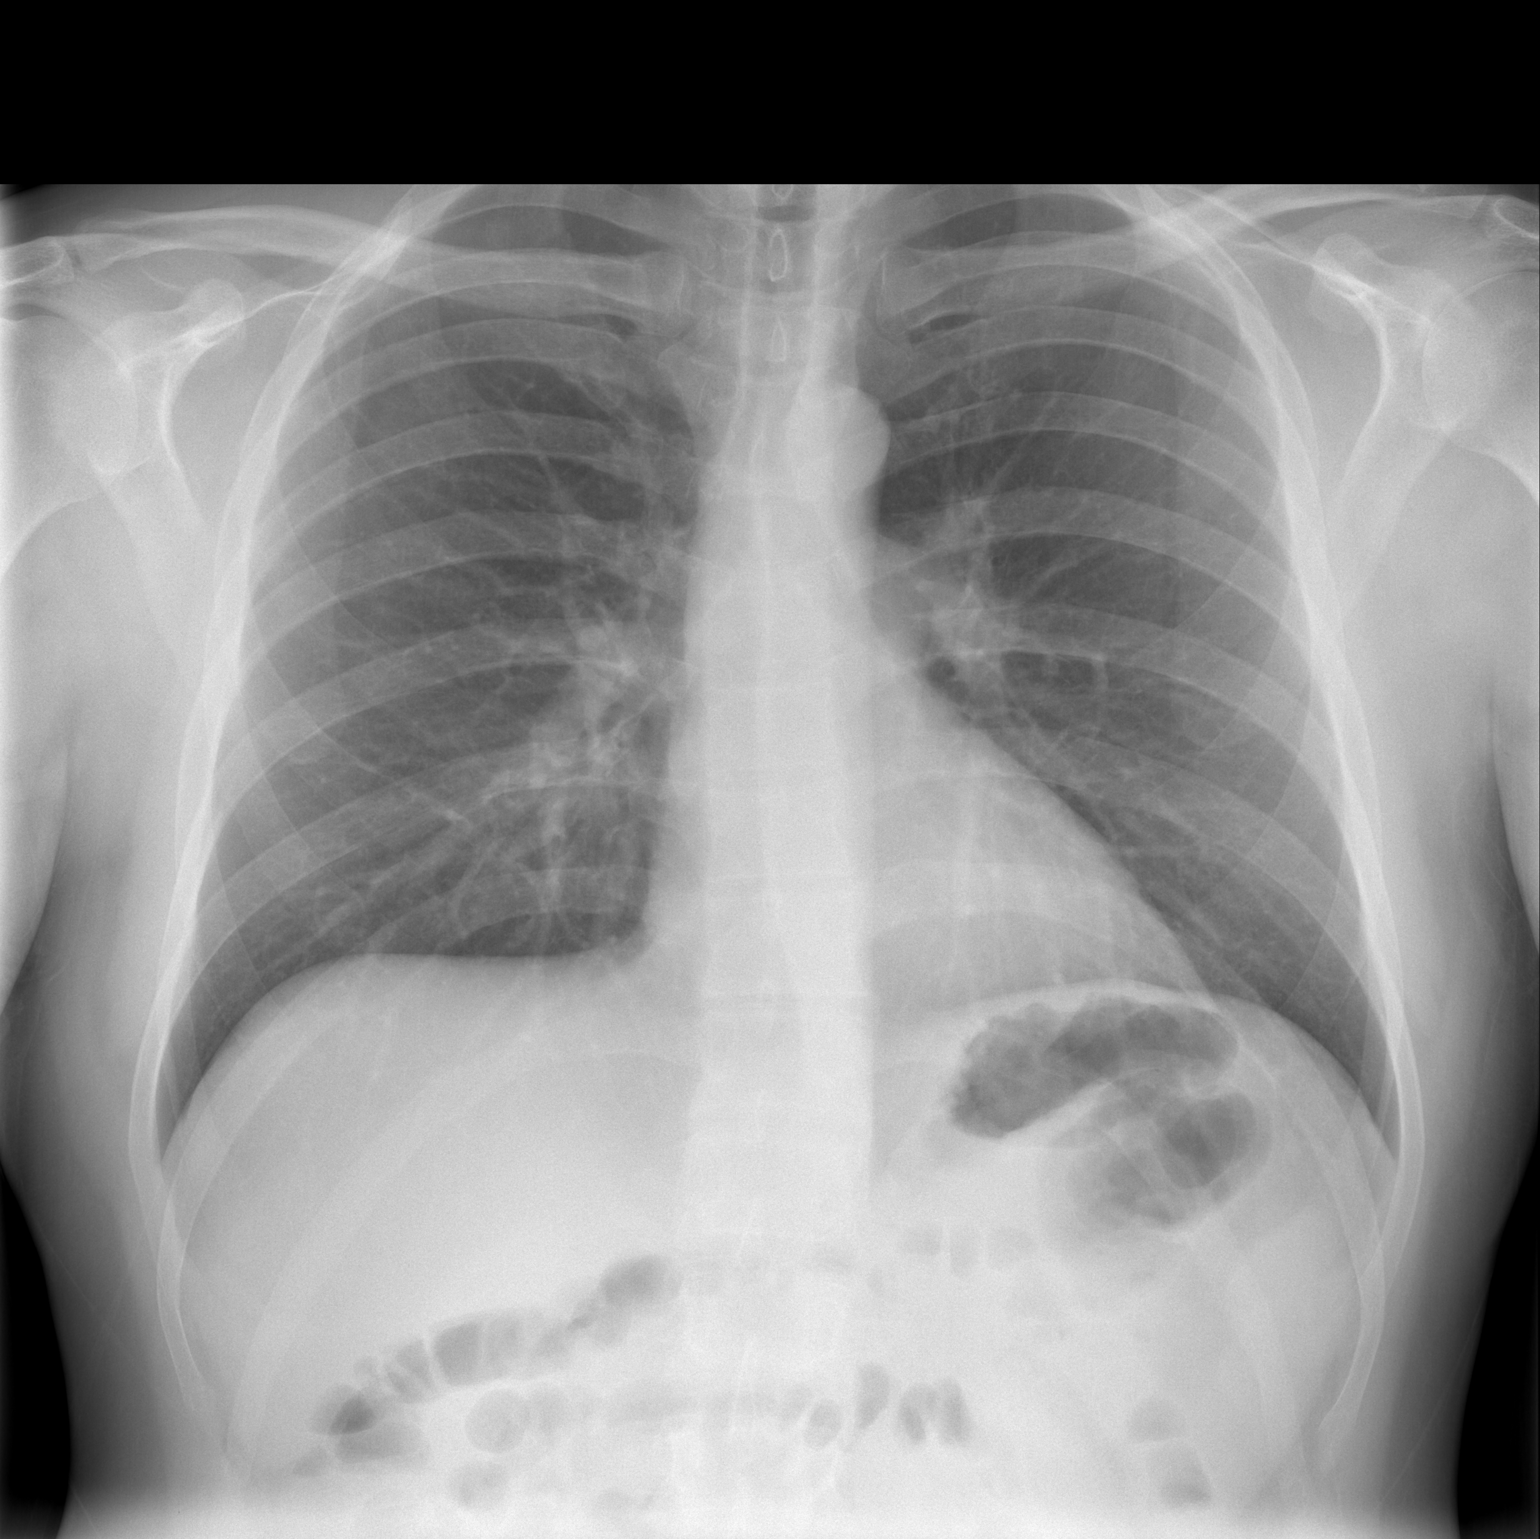

[w chest lat]
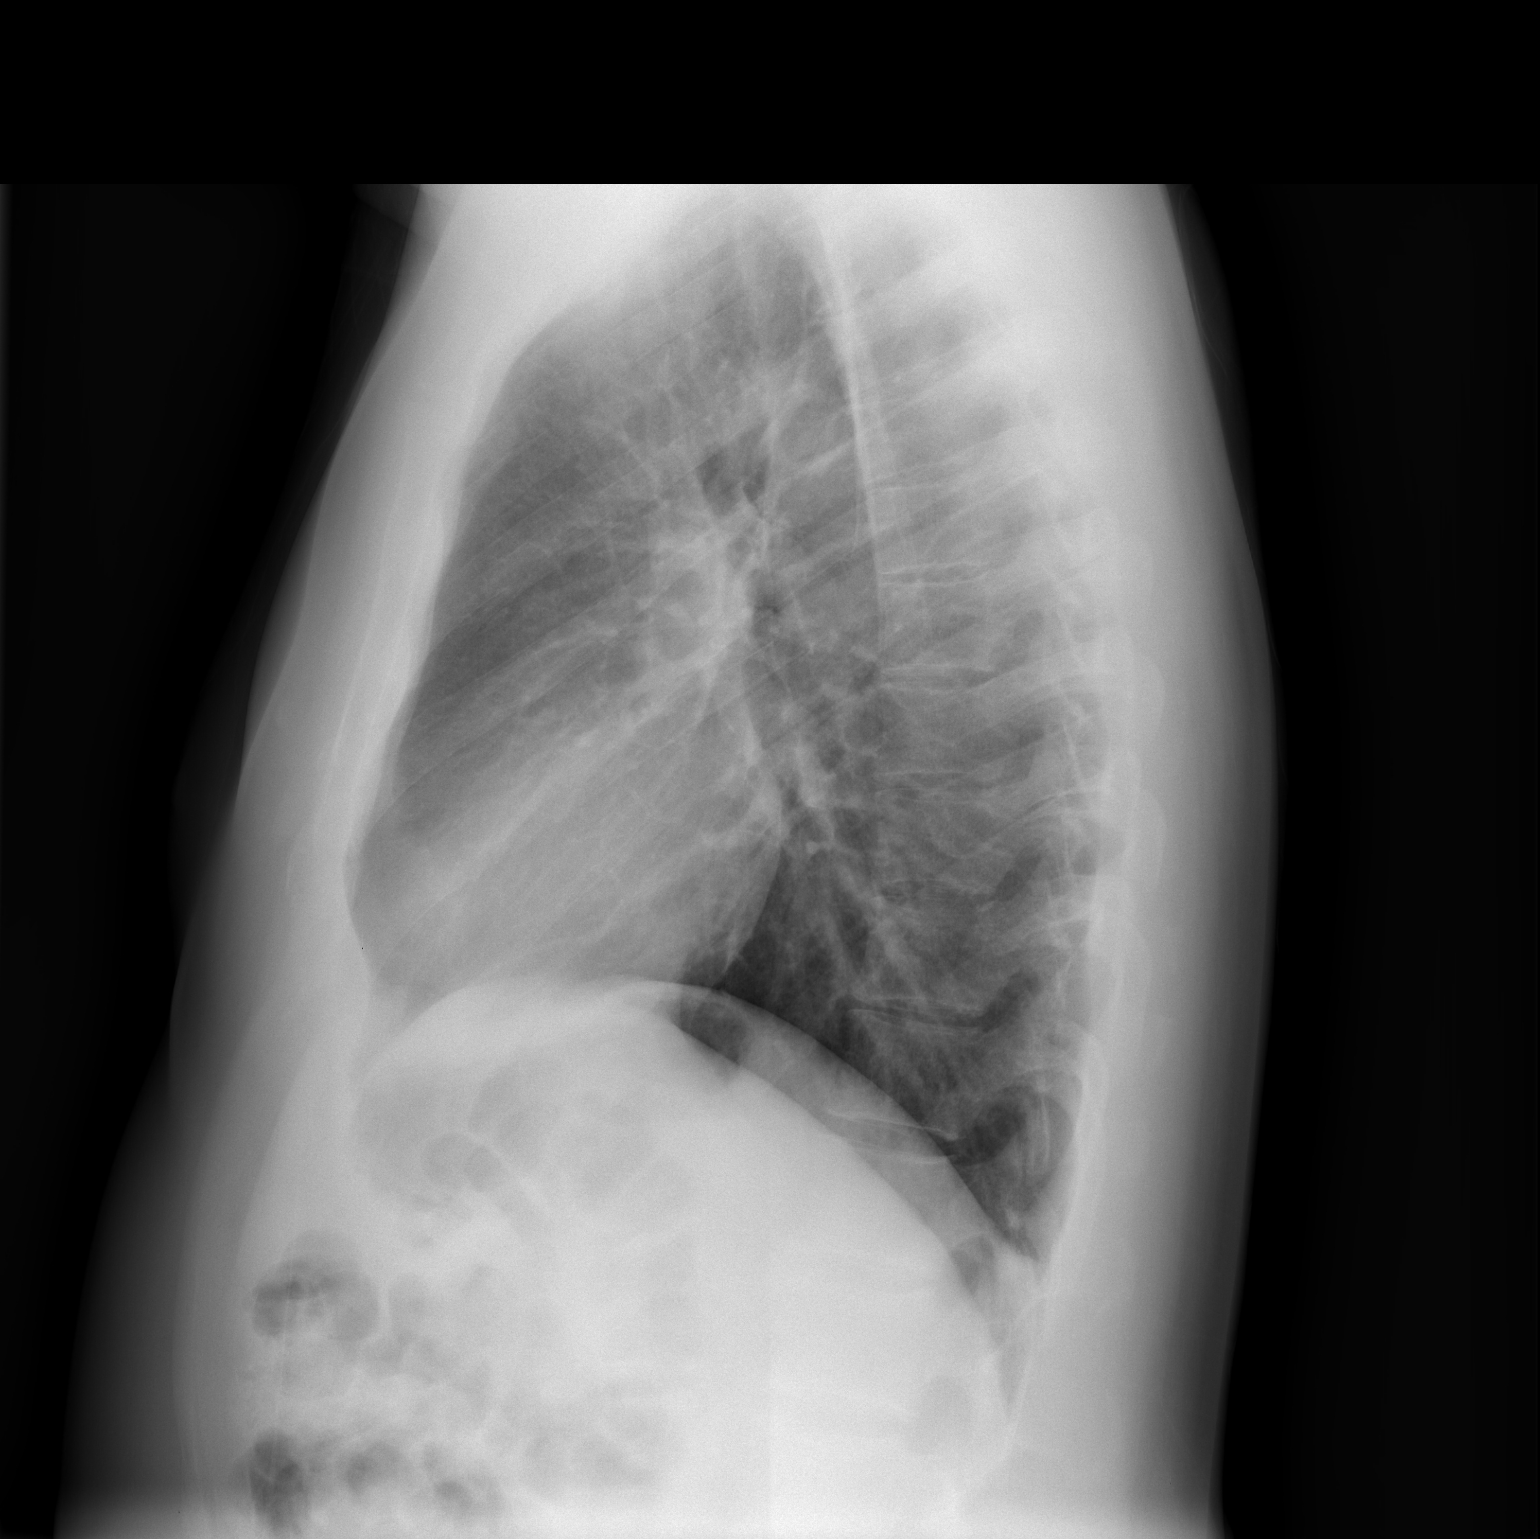

[2 of 2 positions shown; findings below may reference images not displayed]

FINDINGS: Normal cardiomediastinal silhouette.  Lungs well expanded
and clear.  Bony thorax intact.
IMPRESSION: No active chest disease.

## 2010-11-15 ENCOUNTER — Other Ambulatory Visit: Payer: Self-pay | Admitting: Oncology

## 2010-11-15 ENCOUNTER — Encounter (HOSPITAL_BASED_OUTPATIENT_CLINIC_OR_DEPARTMENT_OTHER): Payer: BC Managed Care – PPO | Admitting: Oncology

## 2010-11-15 DIAGNOSIS — C61 Malignant neoplasm of prostate: Secondary | ICD-10-CM

## 2010-11-15 DIAGNOSIS — D473 Essential (hemorrhagic) thrombocythemia: Secondary | ICD-10-CM

## 2010-11-15 LAB — CBC WITH DIFFERENTIAL/PLATELET
Basophils Absolute: 0 10*3/uL (ref 0.0–0.1)
EOS%: 0.7 % (ref 0.0–7.0)
HGB: 15.7 g/dL (ref 13.0–17.1)
MCH: 36.1 pg — ABNORMAL HIGH (ref 27.2–33.4)
MONO%: 6.3 % (ref 0.0–14.0)
NEUT#: 2.7 10*3/uL (ref 1.5–6.5)
RBC: 4.36 10*6/uL (ref 4.20–5.82)
RDW: 13.1 % (ref 11.0–14.6)
lymph#: 0.6 10*3/uL — ABNORMAL LOW (ref 0.9–3.3)

## 2011-03-14 ENCOUNTER — Encounter (HOSPITAL_BASED_OUTPATIENT_CLINIC_OR_DEPARTMENT_OTHER): Payer: BC Managed Care – PPO | Admitting: Oncology

## 2011-03-14 ENCOUNTER — Other Ambulatory Visit: Payer: Self-pay | Admitting: Oncology

## 2011-03-14 DIAGNOSIS — C61 Malignant neoplasm of prostate: Secondary | ICD-10-CM

## 2011-03-14 DIAGNOSIS — D473 Essential (hemorrhagic) thrombocythemia: Secondary | ICD-10-CM

## 2011-03-14 LAB — CBC WITH DIFFERENTIAL/PLATELET
BASO%: 0.3 % (ref 0.0–2.0)
EOS%: 0.8 % (ref 0.0–7.0)
Eosinophils Absolute: 0 10*3/uL (ref 0.0–0.5)
LYMPH%: 17.3 % (ref 14.0–49.0)
MCHC: 35.5 g/dL (ref 32.0–36.0)
MCV: 102.2 fL — ABNORMAL HIGH (ref 79.3–98.0)
MONO%: 6.7 % (ref 0.0–14.0)
NEUT#: 3.2 10*3/uL (ref 1.5–6.5)
Platelets: 267 10*3/uL (ref 140–400)
RBC: 4.18 10*6/uL — ABNORMAL LOW (ref 4.20–5.82)
RDW: 13.4 % (ref 11.0–14.6)

## 2011-03-16 LAB — CBC
MCHC: 35.1
MCV: 86.7
Platelets: 911

## 2011-03-16 LAB — DIFFERENTIAL
Basophils Absolute: 0.1
Lymphs Abs: 1.3
Monocytes Absolute: 0.3

## 2011-03-16 LAB — BCR/ABL GENE REARRANGEMENT QNT, PCR: BCR/ABL ratio: 0

## 2011-03-16 LAB — TISSUE HYBRIDIZATION (BONE MARROW)-NCBH

## 2011-07-18 ENCOUNTER — Other Ambulatory Visit: Payer: BC Managed Care – PPO | Admitting: Lab

## 2011-07-25 ENCOUNTER — Ambulatory Visit: Payer: BC Managed Care – PPO | Admitting: Oncology

## 2011-11-01 ENCOUNTER — Other Ambulatory Visit: Payer: Self-pay | Admitting: Dermatology

## 2011-11-08 ENCOUNTER — Telehealth: Payer: Self-pay | Admitting: Oncology

## 2011-11-08 ENCOUNTER — Ambulatory Visit (HOSPITAL_BASED_OUTPATIENT_CLINIC_OR_DEPARTMENT_OTHER): Payer: BC Managed Care – PPO | Admitting: Oncology

## 2011-11-08 ENCOUNTER — Other Ambulatory Visit (HOSPITAL_BASED_OUTPATIENT_CLINIC_OR_DEPARTMENT_OTHER): Payer: BC Managed Care – PPO | Admitting: Lab

## 2011-11-08 ENCOUNTER — Encounter: Payer: Self-pay | Admitting: Oncology

## 2011-11-08 VITALS — BP 143/89 | HR 78 | Temp 98.6°F | Ht 70.0 in | Wt 210.0 lb

## 2011-11-08 DIAGNOSIS — C61 Malignant neoplasm of prostate: Secondary | ICD-10-CM | POA: Insufficient documentation

## 2011-11-08 DIAGNOSIS — D473 Essential (hemorrhagic) thrombocythemia: Secondary | ICD-10-CM

## 2011-11-08 LAB — CBC WITH DIFFERENTIAL/PLATELET
BASO%: 0.3 % (ref 0.0–2.0)
EOS%: 0.4 % (ref 0.0–7.0)
HCT: 44.3 % (ref 38.4–49.9)
LYMPH%: 12.8 % — ABNORMAL LOW (ref 14.0–49.0)
MCH: 36.4 pg — ABNORMAL HIGH (ref 27.2–33.4)
MCHC: 35.5 g/dL (ref 32.0–36.0)
MCV: 102.5 fL — ABNORMAL HIGH (ref 79.3–98.0)
MONO%: 6.1 % (ref 0.0–14.0)
NEUT%: 80.4 % — ABNORMAL HIGH (ref 39.0–75.0)
lymph#: 0.5 10*3/uL — ABNORMAL LOW (ref 0.9–3.3)

## 2011-11-08 NOTE — Progress Notes (Signed)
ID: [facility]   DOB: 06/04/1966  MR#: [id number]  WUJ#:[id number]  HISTORY OF PRESENT ILLNESS: Dr. Darrell Stone office has sent me lab work all the way back to January of 2004 and at that time Thomas Stone's platelet count was 409,000, which is essentially at the upper limit of normal.  In August of 2007 the patient saw Dr. Theresia Stone for unrelated symptoms and a CBC was obtained, it showed normal CMET and normal CBC expect for a platelet count of 849,000.  Dr. Theresia Stone repeated the test a couple of days later and the platelets had gone down to 746,000.  He talked with the hematologist possibly even myself and we decided that perhaps we were dealing with a reactive process and so platelets seem to be going down and suggested repeat in six to eight weeks.  The patient was given the appointment, but infact did not show.  He returned at the clinic with complaints of hematuria or hematospermia (not clear initially) September of 2008 and he was treated with Cipro with improvement and resolution of the symptom.  However, on October 7 the patient's CBC results came in and showed a platelet count of 999,000 or greater.  Again his hemoglobin and white cell count and differential were unremarkable. He was referred for hematologic evaluation 03/13/2007. His subsequent history is as detailed below  INTERVAL HISTORY: Thomas Stone returns today for followup of his essential thrombocytosis and history of prostate cancer. He is doing well with the Hydrea, with no side effects that he is aware of. He sees [facility] Stone regularly for prostate followup and tells me his most recent PSA was 0.02. Thomas Stone father died from metastatic prostate cancer August of last year. His mother is doing well. He continues to work full-time, exercises regularly, and is active in church.  REVIEW OF SYSTEMS: His ED has improved significantly with Cialis at a 5 mg dose. He has had no problems with urine stream or bladder control. There have been no unusual  pain, rash, bleeding, unexplained fatigue or weight loss, or other symptoms of concern. In fact a detailed review of systems today was entirely negative.  PAST MEDICAL HISTORY: No past medical history on file.  PAST SURGICAL HISTORY: No past surgical history on file.  FAMILY HISTORY The patient's father died from metastatic prostate cancer August 2012. He also had a history of CAD/CABG.  The patient's mother is alive at 65 and he has two brothers. The only other cancer in the family is a paternal grandmother who developed melanoma at the age of 25 and died apparently from melanoma two years later.  There is no history of clotting or bleeding problems or any other blood problems in the family to the patient's knowledge.  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  They have a 51 year old daughter and an 24 year old son.  They go to the [facility].   ADVANCED DIRECTIVES:  HEALTH MAINTENANCE: History  Substance Use Topics  . Smoking status: Never Smoker   . Smokeless tobacco: Not on file  . Alcohol Use: No     Colonoscopy:  PAP:  Bone density:  Lipid panel:  Allergies not on file  Current Outpatient Prescriptions  Medication Sig Dispense Refill  . CIALIS 5 MG tablet       . hydroxyurea (HYDREA) 500 MG capsule Take 500 mg by mouth daily. May take with food to minimize GI side effects. 1 on  odd days\ 2 on even days        OBJECTIVE: Middle-aged white male who appears fit Filed Vitals:   11/08/11 1001  BP: 143/89  Pulse: 78  Temp: 98.6 F (37 C)     Body mass index is 30.13 kg/(m^2).    ECOG FS: 0  Sclerae unicteric Oropharynx clear No peripheral adenopathy Lungs no rales or rhonchi Heart regular rate and rhythm Abd soft nontender positive bowel sounds MSK no focal spinal tenderness, no peripheral edema Neuro: nonfocal  LAB RESULTS: Lab Results  Component Value  Date   WBC 3.8* 11/08/2011   NEUTROABS 3.1 11/08/2011   HGB 15.7 11/08/2011   HCT 44.3 11/08/2011   MCV 102.5* 11/08/2011   PLT 234 11/08/2011      Chemistry      Component Value Date/Time   NA 140 04/17/2009 0650   K 4.0 04/17/2009 0650   CL 106 04/17/2009 0650   CO2 29 04/17/2009 0650   BUN 6 04/17/2009 0650   CREATININE 0.87 04/17/2009 0650      Component Value Date/Time   CALCIUM 8.7 04/17/2009 0650   ALKPHOS 31* 04/13/2009 0845   AST 28 04/13/2009 0845   ALT 60* 04/13/2009 0845   BILITOT 0.9 04/13/2009 0845       No results found for this basename: LABCA2    No components found with this basename: LABCA125    No results found for this basename: INR:1;PROTIME:1 in the last 168 hours  Urinalysis    Component Value Date/Time   COLORURINE YELLOW 04/13/2009 0835   APPEARANCEUR CLEAR 04/13/2009 0835   LABSPEC 1.012 04/13/2009 0835   PHURINE 7.5 04/13/2009 0835   GLUCOSEU NEGATIVE 04/13/2009 0835   HGBUR NEGATIVE 04/13/2009 0835   BILIRUBINUR NEGATIVE 04/13/2009 0835   KETONESUR NEGATIVE 04/13/2009 0835   PROTEINUR NEGATIVE 04/13/2009 0835   UROBILINOGEN 0.2 04/13/2009 0835   NITRITE NEGATIVE 04/13/2009 0835   LEUKOCYTESUR NEGATIVE MICROSCOPIC NOT DONE ON URINES WITH NEGATIVE PROTEIN, BLOOD, LEUKOCYTES, NITRITE, OR GLUCOSE <1000 mg/dL. 04/13/2009 0835    STUDIES: No new results found.  ASSESSMENT: 46 y.o. [facility] man with:   (1) Essential thrombocytosis diagnosed in 2007, well controlled on a stable dose of Hydrea (500 mg alternating with 1000 mg daily).   (2) Status post radical prostatectomy in November 2010 for a T3a N0 M0, Gleason 7 (3 plus 4) prostate adenocarcinoma, with a positive margin and a preop PSA of 29, status post adjuvant tomotherapy completed May 2011, followed by Dr. Earlene Stone.    PLAN: He is doing very well as far as both his diagnoses are concerned. With regards to the essential thrombocytosis, we are continuing the Hydrea as above we will continue to check his  complete blood count every 3 months and he will see me again in one year.  His prostate cancer is followed by Dr. Earlene Stone. [facility] had many questions of regarding his prognosis today, but it certainly is favorable that he is doing half years out with essentially a nondetectable PSA.  He knows to call for any problems that may develop before the next visit.   Romain Erion C    11/08/2011

## 2011-11-08 NOTE — Telephone Encounter (Signed)
gve the pt his aug-June 2014 appt calendars

## 2011-11-11 ENCOUNTER — Other Ambulatory Visit: Payer: Self-pay | Admitting: Oncology

## 2011-12-13 ENCOUNTER — Other Ambulatory Visit: Payer: Self-pay | Admitting: *Deleted

## 2011-12-13 MED ORDER — HYDROXYUREA 500 MG PO CAPS: ORAL_CAPSULE | ORAL | Status: DC

## 2011-12-28 ENCOUNTER — Other Ambulatory Visit: Payer: Self-pay | Admitting: *Deleted

## 2011-12-28 MED ORDER — HYDROXYUREA 500 MG PO CAPS: ORAL_CAPSULE | ORAL | Status: DC

## 2012-01-31 ENCOUNTER — Other Ambulatory Visit (HOSPITAL_BASED_OUTPATIENT_CLINIC_OR_DEPARTMENT_OTHER): Payer: BC Managed Care – PPO | Admitting: Lab

## 2012-01-31 DIAGNOSIS — D473 Essential (hemorrhagic) thrombocythemia: Secondary | ICD-10-CM

## 2012-01-31 LAB — CBC WITH DIFFERENTIAL/PLATELET
BASO%: 0.5 % (ref 0.0–2.0)
EOS%: 0.7 % (ref 0.0–7.0)
MCH: 36.5 pg — ABNORMAL HIGH (ref 27.2–33.4)
MCHC: 35.9 g/dL (ref 32.0–36.0)
MCV: 101.5 fL — ABNORMAL HIGH (ref 79.3–98.0)
MONO%: 7.4 % (ref 0.0–14.0)
NEUT#: 2.6 10*3/uL (ref 1.5–6.5)
RBC: 4.38 10*6/uL (ref 4.20–5.82)
RDW: 13.4 % (ref 11.0–14.6)

## 2012-02-06 ENCOUNTER — Ambulatory Visit: Payer: BC Managed Care – PPO | Admitting: Radiation Oncology

## 2012-02-08 ENCOUNTER — Encounter: Payer: Self-pay | Admitting: Radiation Oncology

## 2012-02-08 ENCOUNTER — Ambulatory Visit
Admission: RE | Admit: 2012-02-08 | Discharge: 2012-02-08 | Disposition: A | Discharge status: Home / Self Care | Payer: BC Managed Care – PPO | Source: Ambulatory Visit | Attending: Radiation Oncology | Admitting: Radiation Oncology

## 2012-02-08 VITALS — BP 154/78 | HR 78 | Temp 98.1°F | Resp 20 | Wt 207.2 lb

## 2012-02-08 DIAGNOSIS — C61 Malignant neoplasm of prostate: Secondary | ICD-10-CM

## 2012-02-08 HISTORY — DX: Thrombocytosis, unspecified: D75.839

## 2012-02-08 HISTORY — DX: Male erectile dysfunction, unspecified: N52.9

## 2012-02-08 HISTORY — DX: Personal history of irradiation: Z92.3

## 2012-02-08 HISTORY — DX: Essential (hemorrhagic) thrombocythemia: D47.3

## 2012-02-08 HISTORY — DX: Malignant neoplasm of prostate: C61

## 2012-02-08 NOTE — Progress Notes (Signed)
Followup note:  The patient returns today approximately to use in 4 months following completion of postoperative radiation therapy in the management of his stage T3 a adenocarcinoma prostate. He is being followed closely by Dr. Earlene Plater and he tells me that his PSA level is essentially undetectable. He is to do well from a GU and GI standpoint, but he unhappy with his erectile function. He is tried prostaglandin injections, but these were too uncomfortable. He does respond to 20 mg of Cialis, stopped by today to inquire as to whether not there are other options for his erectile dysfunction.  I explained to the patient that he should have a thorough discussion with Dr. Earlene Plater who scheduled see for a followup visit next month. I explained him that there are numerous options that can be reviewed by Dr. Earlene Plater.  Plan: He is to meet with Dr. Earlene Plater in the near future. I wished him well.

## 2012-02-08 NOTE — Progress Notes (Signed)
patient here for f/u  For prostate cancer treated radiation back 08/19/09-10/06/09 Alert,oriented x3, no dysuria, no nocturia  Last saw Dr. Earlene Plater 6 months ago, next appt this September, last seen Dr. Darnelle Catalan, 11/08/11 11:23 AM

## 2012-02-11 ENCOUNTER — Other Ambulatory Visit: Payer: Self-pay | Admitting: Oncology

## 2012-04-24 ENCOUNTER — Other Ambulatory Visit (HOSPITAL_BASED_OUTPATIENT_CLINIC_OR_DEPARTMENT_OTHER): Payer: BC Managed Care – PPO | Admitting: Lab

## 2012-04-24 DIAGNOSIS — D473 Essential (hemorrhagic) thrombocythemia: Secondary | ICD-10-CM

## 2012-04-24 LAB — CBC WITH DIFFERENTIAL/PLATELET
BASO%: 0.3 % (ref 0.0–2.0)
Eosinophils Absolute: 0 10*3/uL (ref 0.0–0.5)
MCHC: 36 g/dL (ref 32.0–36.0)
MONO#: 0.2 10*3/uL (ref 0.1–0.9)
NEUT#: 3 10*3/uL (ref 1.5–6.5)
RBC: 4.42 10*6/uL (ref 4.20–5.82)
RDW: 13 % (ref 11.0–14.6)
WBC: 4 10*3/uL (ref 4.0–10.3)

## 2012-07-17 ENCOUNTER — Other Ambulatory Visit (HOSPITAL_BASED_OUTPATIENT_CLINIC_OR_DEPARTMENT_OTHER): Payer: BC Managed Care – PPO | Admitting: Lab

## 2012-07-17 DIAGNOSIS — D473 Essential (hemorrhagic) thrombocythemia: Secondary | ICD-10-CM

## 2012-07-17 LAB — CBC WITH DIFFERENTIAL/PLATELET
BASO%: 0.4 % (ref 0.0–2.0)
Eosinophils Absolute: 0 10*3/uL (ref 0.0–0.5)
HCT: 45.4 % (ref 38.4–49.9)
LYMPH%: 21.5 % (ref 14.0–49.0)
MONO#: 0.3 10*3/uL (ref 0.1–0.9)
NEUT#: 3.2 10*3/uL (ref 1.5–6.5)
NEUT%: 70 % (ref 39.0–75.0)
Platelets: 280 10*3/uL (ref 140–400)
RBC: 4.67 10*6/uL (ref 4.20–5.82)
WBC: 4.6 10*3/uL (ref 4.0–10.3)
lymph#: 1 10*3/uL (ref 0.9–3.3)

## 2012-10-09 ENCOUNTER — Other Ambulatory Visit: Payer: BC Managed Care – PPO | Admitting: Lab

## 2012-11-08 ENCOUNTER — Other Ambulatory Visit: Payer: Self-pay | Admitting: *Deleted

## 2012-11-08 DIAGNOSIS — D473 Essential (hemorrhagic) thrombocythemia: Secondary | ICD-10-CM

## 2012-11-11 ENCOUNTER — Ambulatory Visit (HOSPITAL_BASED_OUTPATIENT_CLINIC_OR_DEPARTMENT_OTHER): Payer: BC Managed Care – PPO | Admitting: Oncology

## 2012-11-11 ENCOUNTER — Other Ambulatory Visit (HOSPITAL_BASED_OUTPATIENT_CLINIC_OR_DEPARTMENT_OTHER): Payer: BC Managed Care – PPO | Admitting: Lab

## 2012-11-11 VITALS — BP 145/93 | HR 80 | Temp 98.5°F | Resp 20 | Ht 70.0 in | Wt 212.2 lb

## 2012-11-11 DIAGNOSIS — D473 Essential (hemorrhagic) thrombocythemia: Secondary | ICD-10-CM

## 2012-11-11 DIAGNOSIS — C61 Malignant neoplasm of prostate: Secondary | ICD-10-CM

## 2012-11-11 LAB — CBC WITH DIFFERENTIAL/PLATELET
BASO%: 0.7 % (ref 0.0–2.0)
Basophils Absolute: 0 10*3/uL (ref 0.0–0.1)
Eosinophils Absolute: 0 10*3/uL (ref 0.0–0.5)
HCT: 42.8 % (ref 38.4–49.9)
HGB: 15.6 g/dL (ref 13.0–17.1)
LYMPH%: 15.5 % (ref 14.0–49.0)
MCHC: 36.3 g/dL — ABNORMAL HIGH (ref 32.0–36.0)
MONO#: 0.3 10*3/uL (ref 0.1–0.9)
NEUT%: 77.5 % — ABNORMAL HIGH (ref 39.0–75.0)
Platelets: 314 10*3/uL (ref 140–400)
WBC: 4.5 10*3/uL (ref 4.0–10.3)
lymph#: 0.7 10*3/uL — ABNORMAL LOW (ref 0.9–3.3)

## 2012-11-11 MED ORDER — HYDROXYUREA 500 MG PO CAPS: ORAL_CAPSULE | ORAL | Status: AC

## 2012-11-11 NOTE — Progress Notes (Signed)
ID: [facility] Stone   DOB: 09-15-1965  MR#: [id number]  JXB#:[id number]  PCP: Gweneth Dimitri, MD SU: Gaynelle Arabian OTHER MD: Chipper Herb    HISTORY OF PRESENT ILLNESS: Dr. Darrell Jewel office has sent me lab work all the way back to January of 2004 and at that time Cirilo's platelet count was 409,000, which is essentially at the upper limit of normal.  In August of 2007 the patient saw Dr. Theresia Lo for unrelated symptoms and a CBC was obtained, it showed normal CMET and normal CBC expect for a platelet count of 849,000.  Dr. Theresia Lo repeated the test a couple of days later and the platelets had gone down to 746,000.  He talked with the hematologist possibly even myself and we decided that perhaps we were dealing with a reactive process and so platelets seem to be going down and suggested repeat in six to eight weeks.  The patient was given the appointment, but infact did not show.  He returned at the clinic with complaints of hematuria or hematospermia (not clear initially) September of 2008 and he was treated with Cipro with improvement and resolution of the symptom.  However, on October 7 the patient's CBC results came in and showed a platelet count of 999,000 or greater.  Again his hemoglobin and white cell count and differential were unremarkable. He was referred for hematologic evaluation 03/13/2007. His subsequent history is as detailed below  INTERVAL HISTORY: Whitman returns today for followup of his essential thrombocytosis and history of prostate cancer. The interval history is entirely uneventful he continues to work full-time number his children are now 15 and 46, his wife continues to work at [employer]. They're pretty busy the month of May and they're planning a vacation trip to [facility] later this year.  REVIEW OF SYSTEMS: He describes himself as mildly fatigued. He sleeps well, eating well, and of course is working quite hard at his job. In addition he does some walking for exercise. There  has been no bleeding, no rash, and no side effects from the [facility] that he is aware of. A detailed review of systems is otherwise entirely noncontributory  PAST MEDICAL HISTORY: Past Medical History  Diagnosis Date  . History of radiation therapy 08/19/09-10/06/09    prostate ,PSA 07/16/09=0.32  . Prostate cancer 2008 dx    T3aNO adenocarcinoma,PSA 28.7  . ED (erectile dysfunction)   . Thrombocytosis 2007 dx    PAST SURGICAL HISTORY: Past Surgical History  Procedure Laterality Date  . Prostate biopsy  02/17/09    gleason 3+4=7(6/22)bxs+adenocarcinoma  . Prostatectomy  04/16/2009    robotic ,tumor right lobe  . Colonoscopy      hx    FAMILY HISTORY The patient's father died from metastatic prostate cancer August 2012. He also had a history of CAD/CABG.  The patient's mother is alive at 62 and he has two brothers. The only other cancer in the family is a paternal grandmother who developed melanoma at the age of 58 and died apparently from melanoma two years later.  There is no history of clotting or bleeding problems or any other blood problems in the family to the patient's knowledge.  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  They have a 30 year old daughter and a 61 year old son.  They go to the [employer].   ADVANCED DIRECTIVES:  HEALTH MAINTENANCE: History  Substance Use Topics  . Smoking status: Never  Smoker   . Smokeless tobacco: Not on file  . Alcohol Use: No    Not on File  Current Outpatient Prescriptions  Medication Sig Dispense Refill  . CIALIS 5 MG tablet       . hydroxyurea (HYDREA) 500 MG capsule May take with food to minimize GI side effects. 1 on odd days\ 2 on even days  45 capsule  6   No current facility-administered medications for this visit.    OBJECTIVE: Middle-aged white male who appears well Filed Vitals:   11/11/12 0854  BP: 145/93  Pulse:  80  Temp: 98.5 F (36.9 C)  Resp: 20     Body mass index is 30.45 kg/(m^2).    ECOG FS: 0  Sclerae unicteric Oropharynx clear No peripheral adenopathy Lungs no rales or rhonchi Heart regular rate and rhythm Abd soft nontender positive bowel sounds, no splenomegaly MSK no focal spinal tenderness, no peripheral edema Neuro: nonfocal, well oriented, pleasant affect  LAB RESULTS: Lab Results  Component Value Date   WBC 4.5 11/11/2012   NEUTROABS 3.5 11/11/2012   HGB 15.6 11/11/2012   HCT 42.8 11/11/2012   MCV 96.4 11/11/2012   PLT 314 11/11/2012      Chemistry      Component Value Date/Time   NA 140 04/17/2009 0650   K 4.0 04/17/2009 0650   CL 106 04/17/2009 0650   CO2 29 04/17/2009 0650   BUN 6 04/17/2009 0650   CREATININE 0.87 04/17/2009 0650      Component Value Date/Time   CALCIUM 8.7 04/17/2009 0650   ALKPHOS 31* 04/13/2009 0845   AST 28 04/13/2009 0845   ALT 60* 04/13/2009 0845   BILITOT 0.9 04/13/2009 0845       No results found for this basename: LABCA2    No components found with this basename: LABCA125    No results found for this basename: INR,  in the last 168 hours  Urinalysis    Component Value Date/Time   COLORURINE YELLOW 04/13/2009 0835   APPEARANCEUR CLEAR 04/13/2009 0835   LABSPEC 1.012 04/13/2009 0835   PHURINE 7.5 04/13/2009 0835   GLUCOSEU NEGATIVE 04/13/2009 0835   HGBUR NEGATIVE 04/13/2009 0835   BILIRUBINUR NEGATIVE 04/13/2009 0835   KETONESUR NEGATIVE 04/13/2009 0835   PROTEINUR NEGATIVE 04/13/2009 0835   UROBILINOGEN 0.2 04/13/2009 0835   NITRITE NEGATIVE 04/13/2009 0835   LEUKOCYTESUR NEGATIVE MICROSCOPIC NOT DONE ON URINES WITH NEGATIVE PROTEIN, BLOOD, LEUKOCYTES, NITRITE, OR GLUCOSE <1000 mg/dL. 04/13/2009 0835    STUDIES: No results found.   ASSESSMENT: 47 y.o. [place] man with:   (1) Essential thrombocytosis diagnosed in 2007, well controlled on a stable dose of Hydrea (500 mg alternating with 1000 mg daily).   (2) Status post radical  prostatectomy in November 2010 for a T3a N0 M0, Gleason 7 (3 plus 4) prostate adenocarcinoma, with a positive margin and a preop PSA of 29, status post adjuvant tomotherapy completed May 2011, followed by Dr. Earlene Plater.    PLAN: Kamau is doing fine as far as his essential thrombocytosis and prostate cancers are concerned. I am complement were releasing him from followup here. He is as well. I did refill his Hydrea (writing him for 140 tablets to take as before, namely 2 tablets on even days and one tablet on odd days; this is a three-month supply). He knows that we'll be glad to see him at any point in the future if and when the need arises, but as of now no further appointments  are being made for him here.   Demorris Choyce C    11/11/2012

## 2013-01-01 ENCOUNTER — Other Ambulatory Visit: Payer: BC Managed Care – PPO

## 2014-06-11 ENCOUNTER — Other Ambulatory Visit: Payer: Self-pay | Admitting: Cardiology

## 2014-06-11 DIAGNOSIS — Z8249 Family history of ischemic heart disease and other diseases of the circulatory system: Secondary | ICD-10-CM

## 2014-06-23 ENCOUNTER — Ambulatory Visit
Admission: RE | Admit: 2014-06-23 | Discharge: 2014-06-23 | Disposition: A | Discharge status: Home / Self Care | Payer: No Typology Code available for payment source | Source: Ambulatory Visit | Attending: Cardiology | Admitting: Cardiology

## 2014-06-23 DIAGNOSIS — Z8249 Family history of ischemic heart disease and other diseases of the circulatory system: Secondary | ICD-10-CM

## 2016-03-01 NOTE — Progress Notes (Signed)
[facility] [place],  [address] Phone: [phone] Subjective:    I'm seeing this patient by the request  of:  MCNEILL,WENDY, MD   CC: Neck pain  [id number]  [facility] is a 50 y.o. male coming in with complaint of pain. Patient past medical history significant for prostate cancer. Patient states    Past Medical History:  Diagnosis Date  . ED (erectile dysfunction)   . History of radiation therapy 08/19/09-10/06/09   prostate ,PSA 07/16/09=0.32  . Prostate cancer 2008 dx   T3aNO adenocarcinoma,PSA 28.7  . Thrombocytosis 2007 dx   Past Surgical History:  Procedure Laterality Date  . COLONOSCOPY     hx  . PROSTATE BIOPSY  02/17/09   gleason 3+4=7(6/22)bxs+adenocarcinoma  . PROSTATECTOMY  04/16/2009   robotic ,tumor right lobe   Social History   Social History  . Marital status: Married    Spouse name: N/A  . Number of children: N/A  . Years of education: N/A   Social History Main Topics  . Smoking status: Never Smoker  . Smokeless tobacco: Not on file  . Alcohol use No  . Drug use: Unknown  . Sexual activity: Not on file   Other Topics Concern  . Not on file   Social History Narrative  . No narrative on file   Not on File Family History  Problem Relation Age of Onset  . Prostate cancer Father     [id number],metastatic , also cabg/cad,  . Melanoma Paternal Grandmother 64    died 75    Past medical history, social, surgical and family history all reviewed in electronic medical record.  No pertanent information unless stated regarding to the chief complaint.   Review of Systems: No headache, visual changes, nausea, vomiting, diarrhea, constipation, dizziness, abdominal pain, skin rash, fevers, chills, night sweats, weight loss, swollen lymph nodes, body aches, joint swelling, muscle aches, chest pain, shortness of breath, mood changes.   Objective  There were no vitals taken for this visit.    General: No apparent distress alert and oriented x3 mood and affect normal, dressed appropriately.  HEENT: Pupils equal, extraocular movements intact  Respiratory: Patient's speak in full sentences and does not appear short of breath  Cardiovascular: No lower extremity edema, non tender, no erythema  Skin: Warm dry intact with no signs of infection or rash on extremities or on axial skeleton.  Abdomen: Soft nontender  Neuro: Cranial nerves II through XII are intact, neurovascularly intact in all extremities with 2+ DTRs and 2+ pulses.  Lymph: No lymphadenopathy of posterior or anterior cervical chain or axillae bilaterally.  Gait normal with good balance and coordination.  MSK:  Non tender with full range of motion and good stability and symmetric strength and tone of shoulders, elbows, wrist, hip, and ankles bilaterally.     Neck: Inspection unremarkable. No palpable stepoffs. Negative Spurling's maneuver. Full neck range of motion Grip strength and sensation normal in bilateral hands Strength good C4 to T1 distribution No sensory change to C4 to T1 Negative Hoffman sign bilaterally Reflexes normal  Knee: Bilateral Normal to inspection with no erythema or effusion or obvious bony abnormalities. Mild lateral tilt of the patella bilaterally Palpation normal with no warmth, joint line tenderness, patellar tenderness, or condyle tenderness. ROM full in flexion and extension and lower leg rotation. Ligaments with solid consistent endpoints including ACL, PCL, LCL, MCL. Negative Mcmurray's, Apley's, and Thessalonian tests. Non painful patellar compression. Patellar glide with  moderate crepitus. Patellar and quadriceps tendons unremarkable. Hamstring and quadriceps strength is normal.   Osteopathic findings C2 flexed rotated in side bent right C4 flexed rotated and side bent left T3 extended rotated and side bent left L2 flexed rotated and side bent right  Procedure note 97110; 15  minutes spent for Therapeutic exercises as stated in above notes.  This included exercises focusing on stretching, strengthening, with significant focus on eccentric aspects. Shoulder Exercises that included:  Basic scapular stabilization to include adduction and depression of scapula Scaption, focusing on proper movement and good control Internal and External rotation utilizing a theraband, with elbow tucked at side entire time Rows with theraband     Proper technique shown and discussed handout in great detail with ATC.  All questions were discussed and answered.   Impression and Recommendations:     This case required medical decision making of moderate complexity.      Note: This dictation was prepared with Dragon dictation along with smaller phrase technology. Any transcriptional errors that result from this process are unintentional.        '

## 2016-03-02 ENCOUNTER — Ambulatory Visit (INDEPENDENT_AMBULATORY_CARE_PROVIDER_SITE_OTHER): Payer: BLUE CROSS/BLUE SHIELD | Admitting: Family Medicine

## 2016-03-02 ENCOUNTER — Encounter: Payer: Self-pay | Admitting: Family Medicine

## 2016-03-02 VITALS — BP 132/82 | HR 90 | Ht 72.0 in | Wt 206.0 lb

## 2016-03-02 DIAGNOSIS — M222X1 Patellofemoral disorders, right knee: Secondary | ICD-10-CM | POA: Diagnosis not present

## 2016-03-02 DIAGNOSIS — M222X2 Patellofemoral disorders, left knee: Secondary | ICD-10-CM

## 2016-03-02 DIAGNOSIS — M9901 Segmental and somatic dysfunction of cervical region: Secondary | ICD-10-CM | POA: Diagnosis not present

## 2016-03-02 DIAGNOSIS — M999 Biomechanical lesion, unspecified: Secondary | ICD-10-CM | POA: Insufficient documentation

## 2016-03-02 DIAGNOSIS — M9902 Segmental and somatic dysfunction of thoracic region: Secondary | ICD-10-CM | POA: Diagnosis not present

## 2016-03-02 DIAGNOSIS — G8929 Other chronic pain: Secondary | ICD-10-CM

## 2016-03-02 DIAGNOSIS — M9903 Segmental and somatic dysfunction of lumbar region: Secondary | ICD-10-CM | POA: Diagnosis not present

## 2016-03-02 DIAGNOSIS — M542 Cervicalgia: Secondary | ICD-10-CM

## 2016-03-02 MED ORDER — DICLOFENAC SODIUM 2 % TD SOLN: TRANSDERMAL | 3 refills | Status: DC

## 2016-03-02 NOTE — Assessment & Plan Note (Addendum)
Patient's neck pain is likely more postural. Patient even home exercises, topical antibiotic laboratories, icing protocol. Patient will continue to be active. We discussed ergonomics throughout the day that could be beneficial. Follow-up again in 6 weeks for further evaluation and treatment. Xrays pending with patient's history of prostate cancer.

## 2016-03-02 NOTE — Assessment & Plan Note (Signed)
Mild overall. We discussed icing regimen, home exercises which activities to do her regular basis. We discussed which activities to avoid. Patient learned some vastus medialis oblique exercises. Patient will follow-up in 6 weeks. If worsening symptoms consider formal physical therapy.

## 2016-03-02 NOTE — Patient Instructions (Addendum)
Nice to meet you  Ice 20 minutes 2 times daily. Usually after activity and before bed. On wall with heels, butt shoulder and head touching for a goal of 5 minutes daily  Exercises 3 times a week for knee We will get an xray downstairs today  pennsaid pinkie amount topically 2 times daily as needed.  I think you will do great  You do need your wife to massage neck every night, no excuses! See me again in 6 weeks.

## 2016-03-02 NOTE — Assessment & Plan Note (Signed)
Decision today to treat with OMT was based on Physical Exam  After verbal consent patient was treated with HVLA, ME< FPR techniques in cervical, thoracic and lumbar areas  Patient tolerated the procedure well with improvement in symptoms  Patient given exercises, stretches and lifestyle modifications  See medications in patient instructions if given  Patient will follow up in 6 weeks

## 2016-04-08 NOTE — Progress Notes (Signed)
[name] [address], [place] [address] Phone: [phone] Subjective:    CC: Neck pain Follow-up knee pain follow-up  [id number]  Thomas Stone is a 50 y.o. male coming in with complaint of pain. Patient past medical history significant for prostate cancer. Patient states Patient pain seemed to be more secondary to postural issues. Patient responded well to osteopathic manipulation. Patient was encouraged to try to do more ergonomic changes at work as well as postural control at home. Patient states Doing much better. Doing the exercises occasionally. Feels that the manipulation was very helpful. Taking the over-the-counter medications and feels like he is making progress.  Patient was also found to have patellofemoral syndrome of the knees. Patient was given home exercises and discussed which activities not to do. Patient was going to focus on the vastus medialis oblique strengthening. Patient states stable at the moment.  Patient is complaining of the inner thigh numbness. Denies any pain. States that it seemed to be intermittent but seems to be more frequent. Past medical history significant for prostate cancer but denies any fevers chills or any abnormal weight loss. Does state that he has some mild chronic low back pain. No worsening symptoms at night.  X-rays of neck Was ordered the patient never went to get them.     Past Medical History:  Diagnosis Date  . ED (erectile dysfunction)   . History of radiation therapy 08/19/09-10/06/09   prostate ,PSA 07/16/09=0.32  . Prostate cancer ([facility]) 2008 dx   T3aNO adenocarcinoma,PSA 28.7  . Thrombocytosis ([place]) 2007 dx   Past Surgical History:  Procedure Laterality Date  . COLONOSCOPY     hx  . PROSTATE BIOPSY  02/17/09   gleason 3+4=7(6/22)bxs+adenocarcinoma  . PROSTATECTOMY  04/16/2009   robotic ,tumor right lobe   Social History   Social History  . Marital status: Married    Spouse  name: N/A  . Number of children: N/A  . Years of education: N/A   Social History Main Topics  . Smoking status: Never Smoker  . Smokeless tobacco: None  . Alcohol use No  . Drug use: Unknown  . Sexual activity: Not Asked   Other Topics Concern  . None   Social History Narrative  . None   Not on File Family History  Problem Relation Age of Onset  . Prostate cancer Father     [id number],metastatic , also cabg/cad,  . Melanoma Paternal Grandmother 71    died 60    Past medical history, social, surgical and family history all reviewed in electronic medical record.  No pertanent information unless stated regarding to the chief complaint.   Review of Systems: No headache, visual changes, nausea, vomiting, diarrhea, constipation, dizziness, abdominal pain, skin rash, fevers, chills, night sweats, weight loss, swollen lymph nodes, body aches, joint swelling, muscle aches, chest pain, shortness of breath, mood changes.   Objective  Blood pressure 138/84, pulse 88, height 6' (1.829 m), weight 209 lb (94.8 kg), SpO2 99 %.  Systems examined below as of 04/10/16 General: NAD A&O x3 mood, affect normal  HEENT: Pupils equal, extraocular movements intact no nystagmus Respiratory: not short of breath at rest or with speaking Cardiovascular: No lower extremity edema, non tender Skin: Warm dry intact with no signs of infection or rash on extremities or on axial skeleton. Abdomen: Soft nontender, no masses Neuro: Cranial nerves  intact, neurovascularly intact in all extremities with 2+ DTRs and 2+ pulses. Lymph: No lymphadenopathy  appreciated today  Gait normal with good balance and coordination.  MSK: Non tender with full range of motion and good stability and symmetric strength and tone of shoulders, elbows, wrist,  hips and ankles bilaterally.      Neck: Inspection unremarkable. No palpable stepoffs. Negative Spurling's maneuver. Full neck range of motion Grip strength and sensation  normal in bilateral hands Strength good C4 to T1 distribution No sensory change to C4 to T1 Negative Hoffman sign bilaterally Reflexes normal  Back Exam:  Inspection: Unremarkable  Motion: Flexion 45 deg, Extension 25 deg, Side Bending to 45 deg bilaterally,  Rotation to 45 deg bilaterally  SLR laying: Negative  XSLR laying: Negative  Palpable tenderness: Minimal tenderness over the sacroiliac joint bilaterally. FABER: negative. Sensory change: Gross sensation intact to all lumbar and sacral dermatomes.  Reflexes: 2+ at both patellar tendons, 2+ at achilles tendons, Babinski's downgoing.  Strength at foot  Plantar-flexion: 5/5 Dorsi-flexion: 5/5 Eversion: 5/5 Inversion: 5/5  Leg strength  Quad: 5/5 Hamstring: 5/5 Hip flexor: 5/5 Hip abductors: 5/5  Gait unremarkable.   Knee: Bilateral Normal to inspection with no erythema or effusion or obvious bony abnormalities. Mild lateral tilt of the patella bilaterally Palpation normal with no warmth, joint line tenderness, patellar tenderness, or condyle tenderness. ROM full in flexion and extension and lower leg rotation. Ligaments with solid consistent endpoints including ACL, PCL, LCL, MCL. Negative Mcmurray's, Apley's, and Thessalonian tests. Non painful patellar compression. Patellar glide with mild crepitus. Patellar and quadriceps tendons unremarkable. Hamstring and quadriceps strength is normal. ] Mild change from previous exam  Osteopathic findings C2 flexed rotated in side bent right C6 flexed rotated and side bent left T3 extended rotated and side bent left L2 flexed rotated and side bent right     Impression and Recommendations:     This case required medical decision making of moderate complexity.      Note: This dictation was prepared with Dragon dictation along with smaller phrase technology. Any transcriptional errors that result from this process are unintentional.        '

## 2016-04-10 ENCOUNTER — Ambulatory Visit (INDEPENDENT_AMBULATORY_CARE_PROVIDER_SITE_OTHER): Payer: BLUE CROSS/BLUE SHIELD | Admitting: Family Medicine

## 2016-04-10 ENCOUNTER — Encounter: Payer: Self-pay | Admitting: Family Medicine

## 2016-04-10 ENCOUNTER — Ambulatory Visit (INDEPENDENT_AMBULATORY_CARE_PROVIDER_SITE_OTHER)
Admission: RE | Admit: 2016-04-10 | Discharge: 2016-04-10 | Disposition: A | Discharge status: Home / Self Care | Payer: BLUE CROSS/BLUE SHIELD | Source: Ambulatory Visit | Attending: Family Medicine | Admitting: Family Medicine

## 2016-04-10 VITALS — BP 138/84 | HR 88 | Ht 72.0 in | Wt 209.0 lb

## 2016-04-10 DIAGNOSIS — G8929 Other chronic pain: Secondary | ICD-10-CM | POA: Diagnosis not present

## 2016-04-10 DIAGNOSIS — R200 Anesthesia of skin: Secondary | ICD-10-CM | POA: Diagnosis not present

## 2016-04-10 DIAGNOSIS — M222X1 Patellofemoral disorders, right knee: Secondary | ICD-10-CM | POA: Diagnosis not present

## 2016-04-10 DIAGNOSIS — M999 Biomechanical lesion, unspecified: Secondary | ICD-10-CM

## 2016-04-10 DIAGNOSIS — C61 Malignant neoplasm of prostate: Secondary | ICD-10-CM

## 2016-04-10 DIAGNOSIS — M542 Cervicalgia: Secondary | ICD-10-CM | POA: Diagnosis not present

## 2016-04-10 DIAGNOSIS — M222X2 Patellofemoral disorders, left knee: Secondary | ICD-10-CM

## 2016-04-10 NOTE — Assessment & Plan Note (Signed)
Decision today to treat with OMT was based on Physical Exam  After verbal consent patient was treated with HVLA, ME< FPR techniques in cervical, thoracic and lumbar areas  Patient tolerated the procedure well with improvement in symptoms  Patient given exercises, stretches and lifestyle modifications  See medications in patient instructions if given  Patient will follow up in 8 weeks

## 2016-04-10 NOTE — Assessment & Plan Note (Signed)
Stable no change

## 2016-04-10 NOTE — Assessment & Plan Note (Signed)
Groin area patient states. On exam today patient has full strength. Negative straight leg test. Past pedicle history significant for prostatectomy as well as radiation. Started on B12 and B6. If worsening symptoms further imaging will be needed. X-rays of the back is ordered today.

## 2016-04-10 NOTE — Assessment & Plan Note (Signed)
X-rays still pending. Encourage ergonomics, discussed icing regimen and home exercises. Patient will continue other medications. Patient is on hydroxyurea and if worsening symptoms in the knee consider changing.

## 2016-04-10 NOTE — Patient Instructions (Signed)
Good to see you  Ice is your friend Continue what you are doing Xray downstairs B12 1048mcg daily  B6 200mg  daily  See me again in 2 months!

## 2016-06-10 NOTE — Progress Notes (Signed)
[facility] [address], [place] [address] Phone: [phone] Subjective:    CC: Neck pain Follow-up knee pain follow-up  [id number]  Thomas Stone is a 51 y.o. male coming in with complaint of pain. Patient past medical history significant for prostate cancer. Patient states Patient pain seemed to be more secondary to postural issues. Patient responded well to osteopathic manipulation.  Update 06/12/2016-  Mild neck pain but doing well.   Patient was also found to have patellofemoral syndrome of the knees. Patient was doing conservative therapy and seemed to be stable. Patient states doing well, no pain   Patient is complaining of the inner thigh numbness. Resolved as well after last manipulation   Patient was taken x-rays of his neck. X-rays taken 04/10/2016 were independently visualized by me. Patient did have mild foraminal narrowing at C3-C4 mostly on the right but otherwise fairly unremarkable.    Past Medical History:  Diagnosis Date  . ED (erectile dysfunction)   . History of radiation therapy 08/19/09-10/06/09   prostate ,PSA 07/16/09=0.32  . Prostate cancer ([facility]) 2008 dx   T3aNO adenocarcinoma,PSA 28.7  . Thrombocytosis ([facility]) 2007 dx   Past Surgical History:  Procedure Laterality Date  . COLONOSCOPY     hx  . PROSTATE BIOPSY  02/17/09   gleason 3+4=7(6/22)bxs+adenocarcinoma  . PROSTATECTOMY  04/16/2009   robotic ,tumor right lobe   Social History   Social History  . Marital status: Married    Spouse name: N/A  . Number of children: N/A  . Years of education: N/A   Social History Main Topics  . Smoking status: Never Smoker  . Smokeless tobacco: None  . Alcohol use No  . Drug use: Unknown  . Sexual activity: Not Asked   Other Topics Concern  . None   Social History Narrative  . None   Allergies  Allergen Reactions  . Penicillins Other (See Comments)    Not documented in historical    Family History    Problem Relation Age of Onset  . Prostate cancer Father     [id number],metastatic , also cabg/cad,  . Melanoma Paternal Grandmother 6    died 55    Past medical history, social, surgical and family history all reviewed in electronic medical record.  No pertanent information unless stated regarding to the chief complaint.   Review of Systems: No headache, visual changes, nausea, vomiting, diarrhea, constipation, dizziness, abdominal pain, skin rash, fevers, chills, night sweats, weight loss, swollen lymph nodes, body aches, joint swelling, muscle aches, chest pain, shortness of breath, mood changes.   Objective  Blood pressure 132/84, pulse 91, height 6' (1.829 m), weight 215 lb (97.5 kg), SpO2 97 %.  Systems examined below as of 06/12/16 General: NAD A&O x3 mood, affect normal  HEENT: Pupils equal, extraocular movements intact no nystagmus Respiratory: not short of breath at rest or with speaking Cardiovascular: No lower extremity edema, non tender Skin: Warm dry intact with no signs of infection or rash on extremities or on axial skeleton. Abdomen: Soft nontender, no masses Neuro: Cranial nerves  intact, neurovascularly intact in all extremities with 2+ DTRs and 2+ pulses. Lymph: No lymphadenopathy appreciated today  Gait normal with good balance and coordination.  MSK: Non tender with full range of motion and good stability and symmetric strength and tone of shoulders, elbows, wrist,  knee hips and ankles bilaterally.       Neck: Inspection unremarkable. No palpable stepoffs. Negative Spurling's maneuver. Full  neck range of motion Grip strength and sensation normal in bilateral hands Strength good C4 to T1 distribution No sensory change to C4 to T1 Negative Hoffman sign bilaterally Reflexes normal     Osteopathic findings Cervical C2 flexed rotated and side bent right C4 flexed rotated and side bent left C6 flexed rotated and side bent left T3 extended rotated and  side bent right inhaled third rib T9 extended rotated and side bent left L2 flexed rotated and side bent right Sacrum right on right      Impression and Recommendations:     This case required medical decision making of moderate complexity.      Note: This dictation was prepared with Dragon dictation along with smaller phrase technology. Any transcriptional errors that result from this process are unintentional.        '

## 2016-06-12 ENCOUNTER — Encounter: Payer: Self-pay | Admitting: Family Medicine

## 2016-06-12 ENCOUNTER — Ambulatory Visit (INDEPENDENT_AMBULATORY_CARE_PROVIDER_SITE_OTHER): Payer: BLUE CROSS/BLUE SHIELD | Admitting: Family Medicine

## 2016-06-12 VITALS — BP 132/84 | HR 91 | Ht 72.0 in | Wt 215.0 lb

## 2016-06-12 DIAGNOSIS — M999 Biomechanical lesion, unspecified: Secondary | ICD-10-CM | POA: Diagnosis not present

## 2016-06-12 DIAGNOSIS — M542 Cervicalgia: Secondary | ICD-10-CM | POA: Diagnosis not present

## 2016-06-12 NOTE — Assessment & Plan Note (Signed)
Decision today to treat with OMT was based on Physical Exam  After verbal consent patient was treated with HVLA, ME< FPR techniques in cervical, thoracic and lumbar areas  Patient tolerated the procedure well with improvement in symptoms  Patient given exercises, stretches and lifestyle modifications  See medications in patient instructions if given  Patient will follow up in 12 weeks

## 2016-06-12 NOTE — Assessment & Plan Note (Signed)
Stable, encouraged HEP and posture. Discussed upper back strengthening.  Patient will come back and see me again in 3 months for further evaluation and treatment.

## 2017-04-17 ENCOUNTER — Other Ambulatory Visit: Payer: Self-pay | Admitting: Cardiology

## 2017-04-17 DIAGNOSIS — Z8249 Family history of ischemic heart disease and other diseases of the circulatory system: Secondary | ICD-10-CM

## 2017-04-25 ENCOUNTER — Ambulatory Visit
Admission: RE | Admit: 2017-04-25 | Discharge: 2017-04-25 | Disposition: A | Discharge status: Home / Self Care | Payer: BLUE CROSS/BLUE SHIELD | Source: Ambulatory Visit | Attending: Cardiology | Admitting: Cardiology

## 2017-04-25 DIAGNOSIS — Z8249 Family history of ischemic heart disease and other diseases of the circulatory system: Secondary | ICD-10-CM

## 2017-07-30 NOTE — Progress Notes (Signed)
This encounter was created in error - please disregard. This encounter was created in error - please disregard. This encounter was created in error - please disregard. 

## 2017-08-17 DIAGNOSIS — B079 Viral wart, unspecified: Secondary | ICD-10-CM | POA: Diagnosis not present

## 2017-08-17 DIAGNOSIS — M79671 Pain in right foot: Secondary | ICD-10-CM | POA: Diagnosis not present

## 2017-09-06 DIAGNOSIS — B079 Viral wart, unspecified: Secondary | ICD-10-CM | POA: Diagnosis not present

## 2017-09-06 DIAGNOSIS — M79671 Pain in right foot: Secondary | ICD-10-CM | POA: Diagnosis not present

## 2017-09-20 DIAGNOSIS — B079 Viral wart, unspecified: Secondary | ICD-10-CM | POA: Diagnosis not present

## 2017-09-20 DIAGNOSIS — M79671 Pain in right foot: Secondary | ICD-10-CM | POA: Diagnosis not present

## 2017-09-24 DIAGNOSIS — M79641 Pain in right hand: Secondary | ICD-10-CM | POA: Diagnosis not present

## 2017-09-28 DIAGNOSIS — C61 Malignant neoplasm of prostate: Secondary | ICD-10-CM | POA: Diagnosis not present

## 2017-11-21 DIAGNOSIS — L819 Disorder of pigmentation, unspecified: Secondary | ICD-10-CM | POA: Diagnosis not present

## 2017-11-21 DIAGNOSIS — L821 Other seborrheic keratosis: Secondary | ICD-10-CM | POA: Diagnosis not present

## 2017-11-21 DIAGNOSIS — L814 Other melanin hyperpigmentation: Secondary | ICD-10-CM | POA: Diagnosis not present

## 2018-04-17 ENCOUNTER — Ambulatory Visit (INDEPENDENT_AMBULATORY_CARE_PROVIDER_SITE_OTHER): Payer: Commercial Managed Care - PPO | Admitting: Family Medicine

## 2018-04-17 ENCOUNTER — Encounter: Payer: Self-pay | Admitting: Family Medicine

## 2018-04-17 VITALS — BP 110/86 | HR 79 | Ht 72.0 in | Wt 217.0 lb

## 2018-04-17 DIAGNOSIS — M999 Biomechanical lesion, unspecified: Secondary | ICD-10-CM | POA: Diagnosis not present

## 2018-04-17 DIAGNOSIS — M542 Cervicalgia: Secondary | ICD-10-CM | POA: Diagnosis not present

## 2018-04-17 NOTE — Assessment & Plan Note (Addendum)
Decision today to treat with OMT was based on Physical Exam  After verbal consent patient was treated with HVLA, ME, FPR techniques in cervical, thoracic, lumbar and sacral areas  Patient tolerated the procedure well with improvement in symptoms  Patient given exercises, stretches and lifestyle modifications  See medications in patient instructions if given  Patient will follow up in 4-6 weeks 

## 2018-04-17 NOTE — Assessment & Plan Note (Signed)
Multifactorial.  Discussed HEP Discussed which activities to do.  Discussed posture  See me again in 4-8 weeks

## 2018-04-17 NOTE — Progress Notes (Signed)
[facility] [address], [place] [address] Phone: [phone] Subjective:     CC: Back pain follow-up  [id number]  Thomas Stone is a 52 y.o. male coming in with complaint of thoracic spine pain on right scapula. Pain radiates from neck from shoulder to underneath scapula. Has been occurring for one month. ADLs aggravate shoulder. Has used heat and IBU to alleviate pain. Insidious onset.       Past Medical History:  Diagnosis Date  . ED (erectile dysfunction)   . History of radiation therapy 08/19/09-10/06/09   prostate ,PSA 07/16/09=0.32  . Prostate cancer ([facility]) 2008 dx   T3aNO adenocarcinoma,PSA 28.7  . Thrombocytosis ([facility]) 2007 dx   Past Surgical History:  Procedure Laterality Date  . COLONOSCOPY     hx  . PROSTATE BIOPSY  02/17/09   gleason 3+4=7(6/22)bxs+adenocarcinoma  . PROSTATECTOMY  04/16/2009   robotic ,tumor right lobe   Social History   Socioeconomic History  . Marital status: Married    Spouse name: Not on file  . Number of children: Not on file  . Years of education: Not on file  . Highest education level: Not on file  Occupational History  . Not on file  Social Needs  . Financial resource strain: Not on file  . Food insecurity:    Worry: Not on file    Inability: Not on file  . Transportation needs:    Medical: Not on file    Non-medical: Not on file  Tobacco Use  . Smoking status: Never Smoker  Substance and Sexual Activity  . Alcohol use: No  . Drug use: Not on file  . Sexual activity: Not on file  Lifestyle  . Physical activity:    Days per week: Not on file    Minutes per session: Not on file  . Stress: Not on file  Relationships  . Social connections:    Talks on phone: Not on file    Gets together: Not on file    Attends religious service: Not on file    Active member of club or organization: Not on file    Attends meetings of clubs or organizations: Not on file    Relationship status:  Not on file  Other Topics Concern  . Not on file  Social History Narrative  . Not on file   Allergies  Allergen Reactions  . Penicillins Other (See Comments)    Not documented in historical    Family History  Problem Relation Age of Onset  . Prostate cancer Father        [id number],metastatic , also cabg/cad,  . Melanoma Paternal Grandmother 53       died 28     Current Outpatient Medications (Cardiovascular):  Marland Kitchen  CIALIS 5 MG tablet,  .  losartan (COZAAR) 25 MG tablet, Take 25 mg by mouth daily.     Current Outpatient Medications (Other):  .  hydroxyurea (HYDREA) 500 MG capsule, May take with food to minimize GI side effects. 1 on odd days\ 2 on even days    Past medical history, social, surgical and family history all reviewed in electronic medical record.  No pertanent information unless stated regarding to the chief complaint.   Review of Systems:  No headache, visual changes, nausea, vomiting, diarrhea, constipation, dizziness, abdominal pain, skin rash, fevers, chills, night sweats, weight loss, swollen lymph nodes, body aches, joint swelling, chest pain, shortness of breath, mood changes.  Positive muscle aches  Objective  Blood pressure 110/86, pulse 79, height 6' (1.829 m), weight 217 lb (98.4 kg), SpO2 97 %.   General: No apparent distress alert and oriented x3 mood and affect normal, dressed appropriately.  HEENT: Pupils equal, extraocular movements intact  Respiratory: Patient's speak in full sentences and does not appear short of breath  Cardiovascular: No lower extremity edema, non tender, no erythema  Skin: Warm dry intact with no signs of infection or rash on extremities or on axial skeleton.  Abdomen: Soft nontender  Neuro: Cranial nerves II through XII are intact, neurovascularly intact in all extremities with 2+ DTRs and 2+ pulses.  Lymph: No lymphadenopathy of posterior or anterior cervical chain or axillae bilaterally.  Gait normal with good balance  and coordination.  MSK:  Non tender with full range of motion and good stability and symmetric strength and tone of shoulders, elbows, wrist, hip, knee and ankles bilaterally.  Neck: Inspection loss of lordosis. No palpable stepoffs. Negative Spurling's maneuver. Limited range of motion in all planes Grip strength and sensation normal in bilateral hands Strength good C4 to T1 distribution No sensory change to C4 to T1 Negative Hoffman sign bilaterally Reflexes normal Tightness of the trapezius bilaterally  Osteopathic findings C2 flexed rotated and side bent right C4 flexed rotated and side bent left C6 flexed rotated and side bent left T11 extended rotated and side bent left L3 flexed rotated and side bent right Sacrum right on right    Impression and Recommendations:     This case required medical decision making of moderate complexity. The above documentation has been reviewed and is accurate and complete Lyndal Pulley, DO       Note: This dictation was prepared with Dragon dictation along with smaller phrase technology. Any transcriptional errors that result from this process are unintentional.

## 2018-04-17 NOTE — Patient Instructions (Signed)
Good to see you  Ice is your friend Stay active Keep hands within peripheral vision  See me again in 3-4 weeks

## 2018-04-30 DIAGNOSIS — E7800 Pure hypercholesterolemia, unspecified: Secondary | ICD-10-CM | POA: Diagnosis not present

## 2018-04-30 DIAGNOSIS — I119 Hypertensive heart disease without heart failure: Secondary | ICD-10-CM | POA: Diagnosis not present

## 2018-04-30 DIAGNOSIS — I10 Essential (primary) hypertension: Secondary | ICD-10-CM | POA: Diagnosis not present

## 2018-04-30 DIAGNOSIS — D473 Essential (hemorrhagic) thrombocythemia: Secondary | ICD-10-CM | POA: Diagnosis not present

## 2018-04-30 DIAGNOSIS — Z0000 Encounter for general adult medical examination without abnormal findings: Secondary | ICD-10-CM | POA: Diagnosis not present

## 2018-05-15 NOTE — Progress Notes (Signed)
[name] [address], [place] [address] Phone: [phone] Subjective:   [name], am serving as a scribe for Dr. Hulan Saas.    CC: Back pain follow-up  [id number]  Thomas Stone is a 52 y.o. male coming in with complaint of back pain. He does have pain still in the right shoulder and cervical spine when he wakes up in the morning. He said that he slept at his parents house over the holiday and he still experienced pain despite being in another bed. Denies any radiating symptoms.  Patient states that overall does think he is making progress though.  Feels that the home exercise     Past Medical History:  Diagnosis Date  . ED (erectile dysfunction)   . History of radiation therapy 08/19/09-10/06/09   prostate ,PSA 07/16/09=0.32  . Prostate cancer ([facility]) 2008 dx   T3aNO adenocarcinoma,PSA 28.7  . Thrombocytosis ([facility]) 2007 dx   Past Surgical History:  Procedure Laterality Date  . COLONOSCOPY     hx  . PROSTATE BIOPSY  02/17/09   gleason 3+4=7(6/22)bxs+adenocarcinoma  . PROSTATECTOMY  04/16/2009   robotic ,tumor right lobe   Social History   Socioeconomic History  . Marital status: Married    Spouse name: Not on file  . Number of children: Not on file  . Years of education: Not on file  . Highest education level: Not on file  Occupational History  . Not on file  Social Needs  . Financial resource strain: Not on file  . Food insecurity:    Worry: Not on file    Inability: Not on file  . Transportation needs:    Medical: Not on file    Non-medical: Not on file  Tobacco Use  . Smoking status: Never Smoker  Substance and Sexual Activity  . Alcohol use: No  . Drug use: Not on file  . Sexual activity: Not on file  Lifestyle  . Physical activity:    Days per week: Not on file    Minutes per session: Not on file  . Stress: Not on file  Relationships  . Social connections:    Talks on phone: Not on file    Gets  together: Not on file    Attends religious service: Not on file    Active member of club or organization: Not on file    Attends meetings of clubs or organizations: Not on file    Relationship status: Not on file  Other Topics Concern  . Not on file  Social History Narrative  . Not on file   Allergies  Allergen Reactions  . Penicillins Other (See Comments)    Not documented in historical    Family History  Problem Relation Age of Onset  . Prostate cancer Father        [id number],metastatic , also cabg/cad,  . Melanoma Paternal Grandmother 2       died 74     Current Outpatient Medications (Cardiovascular):  .  atorvastatin (LIPITOR) 20 MG tablet, Take 20 mg by mouth daily. Marland Kitchen  CIALIS 5 MG tablet,  .  losartan (COZAAR) 100 MG tablet, Take 100 mg by mouth daily. Marland Kitchen  losartan (COZAAR) 25 MG tablet, Take 25 mg by mouth daily. .  metoprolol succinate (TOPROL-XL) 50 MG 24 hr tablet, Take 50 mg by mouth daily. Take with or immediately following a meal.     Current Outpatient Medications (Other):  .  hydroxyurea (HYDREA)  500 MG capsule, May take with food to minimize GI side effects. 1 on odd days\ 2 on even days    Past medical history, social, surgical and family history all reviewed in electronic medical record.  No pertanent information unless stated regarding to the chief complaint.   Review of Systems:  No headache, visual changes, nausea, vomiting, diarrhea, constipation, dizziness, abdominal pain, skin rash, fevers, chills, night sweats, weight loss, swollen lymph nodes, body aches, joint swelling, ]hest pain, shortness of breath, mood changes.  Positive muscle aches  Objective  Blood pressure 122/88, pulse 81, height 6' (1.829 m), weight 218 lb (98.9 kg), SpO2 97 %.    General: No apparent distress alert and oriented x3 mood and affect normal, dressed appropriately.  HEENT: Pupils equal, extraocular movements intact  Respiratory: Patient's speak in full sentences and  does not appear short of breath  Cardiovascular: No lower extremity edema, non tender, no erythema  Skin: Warm dry intact with no signs of infection or rash on extremities or on axial skeleton.  Abdomen: Soft nontender  Neuro: Cranial nerves II through XII are intact, neurovascularly intact in all extremities with 2+ DTRs and 2+ pulses.  Lymph: No lymphadenopathy of posterior or anterior cervical chain or axillae bilaterally.  Gait normal with good balance and coordination.  MSK:  Non tender with full range of motion and good stability and symmetric strength and tone of shoulders, elbows, wrist, hip, knee and ankles bilaterally.  Neck: Inspection mild loss of lordosis. No palpable stepoffs. Negative Spurling's maneuver.  No radicular symptoms Lacks last 5 to 10 degrees of extension bilaterally Grip strength and sensation normal in bilateral hands Strength good C4 to T1 distribution No sensory change to C4 to T1 Negative Hoffman sign bilaterally Reflexes normal Mild tightness the right trapezius  Osteopathic findings  C2 flexed rotated and side bent right C4 flexed rotated and side bent left T6 extended rotated and side bent left L2 flexed rotated and side bent right Sacrum right on right     Impression and Recommendations:     This case required medical decision making of moderate complexity. The above documentation has been reviewed and is accurate and complete Lyndal Pulley, DO       Note: This dictation was prepared with Dragon dictation along with smaller phrase technology. Any transcriptional errors that result from this process are unintentional.

## 2018-05-16 ENCOUNTER — Ambulatory Visit (INDEPENDENT_AMBULATORY_CARE_PROVIDER_SITE_OTHER): Payer: Commercial Managed Care - PPO | Admitting: Family Medicine

## 2018-05-16 ENCOUNTER — Encounter: Payer: Self-pay | Admitting: Family Medicine

## 2018-05-16 VITALS — BP 122/88 | HR 81 | Ht 72.0 in | Wt 218.0 lb

## 2018-05-16 DIAGNOSIS — M542 Cervicalgia: Secondary | ICD-10-CM | POA: Diagnosis not present

## 2018-05-16 DIAGNOSIS — M999 Biomechanical lesion, unspecified: Secondary | ICD-10-CM

## 2018-05-16 NOTE — Assessment & Plan Note (Signed)
Decision today to treat with OMT was based on Physical Exam  After verbal consent patient was treated with HVLA, ME, FPR techniques in cervical, thoracic, lumbar and sacral areas  Patient tolerated the procedure well with improvement in symptoms  Patient given exercises, stretches and lifestyle modifications  See medications in patient instructions if given  Patient will follow up in 4-8 weeks 

## 2018-05-16 NOTE — Patient Instructions (Signed)
Good to see you  Ice is your friend Consider sleep study  Doing well  See me again in 8 weeks

## 2018-05-16 NOTE — Assessment & Plan Note (Signed)
Patient does have neck pain.  Discussed posture and ergonomics.  Discussed which activities in which ones to avoid.  Patient is due to posture and ergonomics.  Discussed which activities to do which was to avoid.  Follow-up again in 4 to 8 weeks

## 2018-06-05 HISTORY — PX: KNEE ARTHROSCOPY: SHX127

## 2018-06-12 NOTE — Progress Notes (Signed)
[name] [address], [place] [address] Phone: [phone] Subjective:   Thomas Stone, am serving as a scribe for Dr. Hulan Saas.   CC: Left shoulder pain  GNF:[id number]  Thomas Stone is a 53 y.o. male coming in with complaint of left shoulder pain. Pain is persisting. Pain is worse when lying supine. Feels an achiness by the end of the day. Denies any radiating symptoms. Is here for OMT here today.  Pain in the shoulder seems to be worsening.  Waking him up at night.  Certain range of motion has become difficult.     Past Medical History:  Diagnosis Date  . ED (erectile dysfunction)   . History of radiation therapy 08/19/09-10/06/09   prostate ,PSA 07/16/09=0.32  . Prostate cancer ([place]) 2008 dx   T3aNO adenocarcinoma,PSA 28.7  . Thrombocytosis ([place]) 2007 dx   Past Surgical History:  Procedure Laterality Date  . COLONOSCOPY     hx  . PROSTATE BIOPSY  02/17/09   gleason 3+4=7(6/22)bxs+adenocarcinoma  . PROSTATECTOMY  04/16/2009   robotic ,tumor right lobe   Social History   Socioeconomic History  . Marital status: Married    Spouse name: Not on file  . Number of children: Not on file  . Years of education: Not on file  . Highest education level: Not on file  Occupational History  . Not on file  Social Needs  . Financial resource strain: Not on file  . Food insecurity:    Worry: Not on file    Inability: Not on file  . Transportation needs:    Medical: Not on file    Non-medical: Not on file  Tobacco Use  . Smoking status: Never Smoker  Substance and Sexual Activity  . Alcohol use: Stone  . Drug use: Not on file  . Sexual activity: Not on file  Lifestyle  . Physical activity:    Days per week: Not on file    Minutes per session: Not on file  . Stress: Not on file  Relationships  . Social connections:    Talks on phone: Not on file    Gets together: Not on file    Attends religious service: Not on file   Active member of club or organization: Not on file    Attends meetings of clubs or organizations: Not on file    Relationship status: Not on file  Other Topics Concern  . Not on file  Social History Narrative  . Not on file   Allergies  Allergen Reactions  . Penicillins Other (See Comments)    Not documented in historical    Family History  Problem Relation Age of Onset  . Prostate cancer Father        [id number],metastatic , also cabg/cad,  . Melanoma Paternal Grandmother 62       died 63     Current Outpatient Medications (Cardiovascular):  .  atorvastatin (LIPITOR) 20 MG tablet, Take 20 mg by mouth daily. Marland Kitchen  CIALIS 5 MG tablet,  .  losartan (COZAAR) 100 MG tablet, Take 100 mg by mouth daily. Marland Kitchen  losartan (COZAAR) 25 MG tablet, Take 25 mg by mouth daily. .  metoprolol succinate (TOPROL-XL) 50 MG 24 hr tablet, Take 50 mg by mouth daily. Take with or immediately following a meal.     Current Outpatient Medications (Other):  .  hydroxyurea (HYDREA) 500 MG capsule, May take with food to minimize GI side effects. 1 on  odd days\ 2 on even days    Past medical history, social, surgical and family history all reviewed in electronic medical record.  Stone pertanent information unless stated regarding to the chief complaint.   Review of Systems:  Stone headache, visual changes, nausea, vomiting, diarrhea, constipation, dizziness, abdominal pain, skin rash, fevers, chills, night sweats, weight loss, swollen lymph nodes, body aches, joint swelling, chest pain, shortness of breath, mood changes.  Positive muscle aches  Objective  Blood pressure 112/82, pulse 78, height 6' (1.829 m), weight 218 lb (98.9 kg), SpO2 98 %.   General: Stone apparent distress alert and oriented x3 mood and affect normal, dressed appropriately.  HEENT: Pupils equal, extraocular movements intact  Respiratory: Patient's speak in full sentences and does not appear short of breath  Cardiovascular: Stone lower extremity  edema, non tender, Stone erythema  Skin: Warm dry intact with Stone signs of infection or rash on extremities or on axial skeleton.  Abdomen: Soft nontender  Neuro: Cranial nerves II through XII are intact, neurovascularly intact in all extremities with 2+ DTRs and 2+ pulses.  Lymph: Stone lymphadenopathy of posterior or anterior cervical chain or axillae bilaterally.  Gait normal with good balance and coordination.  MSK:  Non tender with full range of motion and good stability and symmetric strength and tone of , elbows, wrist, hip, knee and ankles bilaterally.  Shoulder: Left Inspection reveals Stone abnormalities, atrophy or asymmetry. Palpation is normal with Stone tenderness over AC joint or bicipital groove. Decreased range of motion Rotator cuff strength normal throughout. Impingement Speeds and Yergason's tests normal. Stone labral pathology noted with negative Obrien's, negative clunk and good stability. Normal scapular function observed. Stone painful arc and Stone drop arm sign. Stone apprehension sign Contralateral shoulder unremarkable  Neck: Inspection mild loss of lordosis. Stone palpable stepoffs. Negative Spurling's maneuver. Full neck range of motion Grip strength and sensation normal in bilateral hands Strength good C4 to T1 distribution Stone sensory change to C4 to T1 Negative Hoffman sign bilaterally Reflexes normal Tightness in the left trapezius  After informed written and verbal consent, patient was seated on exam table. Left shoulder was prepped with alcohol swab and utilizing posterior approach, patient's right glenohumeral space was injected with 4:1  marcaine 0.5%: Kenalog 40mg /dL. Patient tolerated the procedure well without immediate complications.  Osteopathic findings  C6 flexed rotated and side bent left T3 extended rotated and side bent left  L3 flexed rotated and side bent right Sacrum right on right    Impression and Recommendations:     This case required medical  decision making of moderate complexity. The above documentation has been reviewed and is accurate and complete Lyndal Pulley, DO       Note: This dictation was prepared with Dragon dictation along with smaller phrase technology. Any transcriptional errors that result from this process are unintentional.

## 2018-06-13 ENCOUNTER — Ambulatory Visit (INDEPENDENT_AMBULATORY_CARE_PROVIDER_SITE_OTHER): Payer: Commercial Managed Care - PPO | Admitting: Family Medicine

## 2018-06-13 ENCOUNTER — Encounter: Payer: Self-pay | Admitting: Family Medicine

## 2018-06-13 VITALS — BP 112/82 | HR 78 | Ht 72.0 in | Wt 218.0 lb

## 2018-06-13 DIAGNOSIS — M7552 Bursitis of left shoulder: Secondary | ICD-10-CM | POA: Diagnosis not present

## 2018-06-13 DIAGNOSIS — M542 Cervicalgia: Secondary | ICD-10-CM | POA: Diagnosis not present

## 2018-06-13 DIAGNOSIS — M999 Biomechanical lesion, unspecified: Secondary | ICD-10-CM | POA: Diagnosis not present

## 2018-06-13 NOTE — Assessment & Plan Note (Signed)
Decision today to treat with OMT was based on Physical Exam  After verbal consent patient was treated with HVLA, ME, FPR techniques in cervical, thoracic, lumbar and sacral areas  Patient tolerated the procedure well with improvement in symptoms  Patient given exercises, stretches and lifestyle modifications  See medications in patient instructions if given  Patient will follow up in 4-8 weeks 

## 2018-06-13 NOTE — Assessment & Plan Note (Signed)
Patient given injection today.  Home exercise given.  Discussed icing regimen.  Discussed ergonomics throughout the day.  Differential includes tendinitis but no weakness so not concerned for rotator cuff tear.  Follow-up again in 4 weeks

## 2018-06-13 NOTE — Assessment & Plan Note (Signed)
Multifactorial.  Still more posture and ergonomics.  Discussed icing regimen and home exercise.  Has responded well to manipulation.  Hopefully will continue.  Patient also had shoulder bursitis and given injection.  Follow-up again 4 to 8 weeks

## 2018-06-13 NOTE — Patient Instructions (Signed)
6 weeks

## 2018-07-11 ENCOUNTER — Ambulatory Visit: Payer: Commercial Managed Care - PPO | Admitting: Family Medicine

## 2018-11-23 NOTE — Progress Notes (Signed)
[name] [address] [place], [place] [address] Phone: [phone] Subjective:   Thomas [name], am serving as a scribe for Dr. Hulan Saas.   CC: neck pain follow up   CLE:[id number]  Thomas [name] is a 53 y.o. male coming in with complaint of right knee pain. Patient has been having chronic pain with knee flexion for years. States that he has a locking sensation when he gets down on his hands and knees. Also complains of popping that elicit pain. Denies doing any exercises for knee. Uses ice and IBU prn. Patient states that the instability never last long.  Only's couple seconds.  Describes the pain is more of an aching sensation.  Patient is also having more back pain.  Has seen multiple times.  Responded well to manipulation.  Describes the pain as a dull ache.  [name] radiation to any extremities.  Not stopping him from any activities.    Past Medical History:  Diagnosis Date  . ED (erectile dysfunction)   . History of radiation therapy 08/19/09-10/06/09   prostate ,PSA 07/16/09=0.32  . Prostate cancer ([place]) 2008 dx   T3aNO adenocarcinoma,PSA 28.7  . Thrombocytosis ([place]) 2007 dx   Past Surgical History:  Procedure Laterality Date  . COLONOSCOPY     hx  . PROSTATE BIOPSY  02/17/09   gleason 3+4=7(6/22)bxs+adenocarcinoma  . PROSTATECTOMY  04/16/2009   robotic ,tumor right lobe   Social History   Socioeconomic History  . Marital status: Married    Spouse name: Not on file  . Number of children: Not on file  . Years of education: Not on file  . Highest education level: Not on file  Occupational History  . Not on file  Social Needs  . Financial resource strain: Not on file  . Food insecurity    Worry: Not on file    Inability: Not on file  . Transportation needs    Medical: Not on file    Non-medical: Not on file  Tobacco Use  . Smoking status: Never Smoker  Substance and Sexual Activity  . Alcohol use: [name]  . Drug use: Not on  file  . Sexual activity: Not on file  Lifestyle  . Physical activity    Days per week: Not on file    Minutes per session: Not on file  . Stress: Not on file  Relationships  . Social Herbalist on phone: Not on file    Gets together: Not on file    Attends religious service: Not on file    Active member of club or organization: Not on file    Attends meetings of clubs or organizations: Not on file    Relationship status: Not on file  Other Topics Concern  . Not on file  Social History Narrative  . Not on file   Allergies  Allergen Reactions  . Penicillins Other (See Comments)    Not documented in historical    Family History  Problem Relation Age of Onset  . Prostate cancer Father        [id number],metastatic , also cabg/cad,  . Melanoma Paternal Grandmother 67       died 72     Current Outpatient Medications (Cardiovascular):  .  atorvastatin (LIPITOR) 20 MG tablet, Take 20 mg by mouth daily. Marland Kitchen  CIALIS 5 MG tablet,  .  losartan (COZAAR) 100 MG tablet, Take 100 mg by mouth daily. Marland Kitchen  losartan (COZAAR) 25  MG tablet, Take 25 mg by mouth daily. .  metoprolol succinate (TOPROL-XL) 50 MG 24 hr tablet, Take 50 mg by mouth daily. Take with or immediately following a meal.     Current Outpatient Medications (Other):  .  hydroxyurea (HYDREA) 500 MG capsule, May take with food to minimize GI side effects. 1 on odd days\ 2 on even days    Past medical history, social, surgical and family history all reviewed in electronic medical record.  [name] pertanent information unless stated regarding to the chief complaint.   Review of Systems:  [name] headache, visual changes, nausea, vomiting, diarrhea, constipation, dizziness, abdominal pain, skin rash, fevers, chills, night sweats, weight loss, swollen lymph nodes, body aches, joint swelling,  chest pain, shortness of breath, mood changes.  Positive muscle aches  Objective  Blood pressure 128/90, pulse 84, height 6' (1.829 m),  weight 219 lb (99.3 kg), SpO2 97 %.   General: [name] apparent distress alert and oriented x3 mood and affect normal, dressed appropriately.  HEENT: Pupils equal, extraocular movements intact  Respiratory: Patient's speak in full sentences and does not appear short of breath  Cardiovascular: [name] lower extremity edema, non tender, [name] erythema  Skin: Warm dry intact with [name] signs of infection or rash on extremities or on axial skeleton.  Abdomen: Soft nontender  Neuro: Cranial nerves II through XII are intact, neurovascularly intact in all extremities with 2+ DTRs and 2+ pulses.  Lymph: [name] lymphadenopathy of posterior or anterior cervical chain or axillae bilaterally.  Gait normal with good balance and coordination.  MSK:  Non tender with full range of motion and good stability and symmetric strength and tone of shoulders, elbows, wrist, hip, and ankles bilaterally.   Knee: Right Normal to inspection with [name] erythema or effusion or obvious bony abnormalities. Palpation normal with [name] warmth, joint line tenderness, patellar tenderness, or condyle tenderness. ROM full in flexion and extension and lower leg rotation. Ligaments with solid consistent endpoints including ACL, PCL, LCL, MCL. Negative Mcmurray's, Apley's, and Thessalonian tests. painful patellar compression. Patellar glide with mild to moderate crepitus. Patellar and quadriceps tendons unremarkable. Hamstring and quadriceps strength is normal.  Contralateral knee unremarkable  MSK US performed of: Right knee  this study was ordered, performed, and interpreted by Charlann Boxer D.O.  Knee: Narrowing of the patellofemoral joint noted especially laterally.  Patient does have some mild breakdown of the cartilage.  S.  IMPRESSION: Patellofemoral.  Neck: Inspection mild loss of lordosis. [name] palpable stepoffs. Negative Spurling's maneuver. Full neck range of motion Grip strength and sensation normal in bilateral hands Strength good C4  to T1 distribution [name] sensory change to C4 to T1 Negative Hoffman sign bilaterally Reflexes normal  Osteopathic findings C2 flexed rotated and side bent right C6 flexed rotated and side bent left T7 extended rotated and side bent left L2 flexed rotated and side bent right Sacrum right on right     Impression and Recommendations:     This case required medical decision making of moderate complexity. The above documentation has been reviewed and is accurate and complete Lyndal Pulley, DO       Note: This dictation was prepared with Dragon dictation along with smaller phrase technology. Any transcriptional errors that result from this process are unintentional.

## 2018-11-25 ENCOUNTER — Ambulatory Visit (INDEPENDENT_AMBULATORY_CARE_PROVIDER_SITE_OTHER)
Admission: RE | Admit: 2018-11-25 | Discharge: 2018-11-25 | Disposition: A | Discharge status: Home / Self Care | Payer: Commercial Managed Care - PPO | Source: Ambulatory Visit | Attending: Family Medicine | Admitting: Family Medicine

## 2018-11-25 ENCOUNTER — Other Ambulatory Visit: Payer: Self-pay

## 2018-11-25 ENCOUNTER — Encounter: Payer: Self-pay | Admitting: Family Medicine

## 2018-11-25 ENCOUNTER — Ambulatory Visit: Payer: Self-pay

## 2018-11-25 ENCOUNTER — Ambulatory Visit (INDEPENDENT_AMBULATORY_CARE_PROVIDER_SITE_OTHER): Payer: Commercial Managed Care - PPO | Admitting: Family Medicine

## 2018-11-25 VITALS — BP 128/90 | HR 84 | Ht 72.0 in | Wt 219.0 lb

## 2018-11-25 DIAGNOSIS — M542 Cervicalgia: Secondary | ICD-10-CM | POA: Diagnosis not present

## 2018-11-25 DIAGNOSIS — M25561 Pain in right knee: Secondary | ICD-10-CM

## 2018-11-25 DIAGNOSIS — G8929 Other chronic pain: Secondary | ICD-10-CM

## 2018-11-25 DIAGNOSIS — M999 Biomechanical lesion, unspecified: Secondary | ICD-10-CM

## 2018-11-25 NOTE — Patient Instructions (Signed)
Good to see you.  Ice 20 minutes 2 times daily. Usually after activity and before bed. Exercises 3 times a week.  Voltaren gel 2x daily Biking for cardio See me again in 5-6 weeks

## 2018-11-25 NOTE — Assessment & Plan Note (Signed)
Neck and back pain is stable.  Discussed with patient in great length about icing regimen, home exercise, which activities to do which wants to avoid.  Patient will increase activity slowly over the course of next several weeks.  Follow-up again in 4 to 8 weeks

## 2018-11-25 NOTE — Assessment & Plan Note (Signed)
Decision today to treat with OMT was based on Physical Exam  After verbal consent patient was treated with HVLA, ME, FPR techniques in cervical, thoracic, lumbar and sacral areas  Patient tolerated the procedure well with improvement in symptoms  Patient given exercises, stretches and lifestyle modifications  See medications in patient instructions if given  Patient will follow up in 4-8 weeks 

## 2018-12-30 ENCOUNTER — Ambulatory Visit: Payer: Commercial Managed Care - PPO | Admitting: Family Medicine

## 2019-04-18 ENCOUNTER — Encounter: Payer: Self-pay | Admitting: Family Medicine

## 2019-04-18 ENCOUNTER — Ambulatory Visit (INDEPENDENT_AMBULATORY_CARE_PROVIDER_SITE_OTHER): Payer: Commercial Managed Care - PPO | Admitting: Family Medicine

## 2019-04-18 DIAGNOSIS — G933 Postviral fatigue syndrome: Secondary | ICD-10-CM

## 2019-04-18 DIAGNOSIS — G9331 Postviral fatigue syndrome: Secondary | ICD-10-CM

## 2019-04-18 MED ORDER — PREDNISONE 50 MG PO TABS: 50.0000 mg | ORAL_TABLET | Freq: Every day | ORAL | 0 refills | Status: DC

## 2019-04-18 NOTE — Progress Notes (Signed)
Virtual Visit via Video Note  I connected with Thomas Stone on 04/18/19 at  3:20 PM EST by a video enabled telemedicine application and verified that I am speaking with the correct person using two identifiers.  Location: Patient: In car setting alone Provider: In office setting   I discussed the limitations of evaluation and management by telemedicine and the availability of in person appointments. The patient expressed understanding and agreed to proceed.  History of Present Illness: 53 year old gentleman who did have Covid diagnosed on October 12.  For 2 weeks patient did have significant amount of symptoms but did not have to be admitted.  Patient states that unfortunately since then his continuing to have discomfort and pain.  Patient feels it is more the fatigue that is causing more difficulty at the moment.  Patient states that he is just not gotten back to himself.  Patient has been able to start working again and is driving but is unable to work out secondary to fatigue.  Denies any fevers chills or any abnormal weight loss or any shortness of breath.    Observations/Objective: Alert and oriented x3.   Assessment and Plan: 53 year old gentleman now nearly 1 month out from Covid positive with continued symptoms.  Discussed prednisone to decrease any inflammation.  No shortness of breath occurring.  Discussed over-the-counter vitamins that could also be beneficial.  Discussed which activities to do which wants to avoid.  Patient will follow up in 1 week if not completely better.   Follow Up Instructions:    I discussed the assessment and treatment plan with the patient. The patient was provided an opportunity to ask questions and all were answered. The patient agreed with the plan and demonstrated an understanding of the instructions.   The patient was advised to call back or seek an in-person evaluation if the symptoms worsen or if the condition fails to improve as  anticipated.  I provided 22 minutes of non-face-to-face time during this encounter.   Lyndal Pulley, DO

## 2019-11-10 DIAGNOSIS — N529 Male erectile dysfunction, unspecified: Secondary | ICD-10-CM | POA: Diagnosis not present

## 2019-11-10 DIAGNOSIS — C61 Malignant neoplasm of prostate: Secondary | ICD-10-CM | POA: Diagnosis not present

## 2019-11-19 DIAGNOSIS — R609 Edema, unspecified: Secondary | ICD-10-CM | POA: Diagnosis not present

## 2019-11-27 DIAGNOSIS — D229 Melanocytic nevi, unspecified: Secondary | ICD-10-CM | POA: Diagnosis not present

## 2019-11-27 DIAGNOSIS — L821 Other seborrheic keratosis: Secondary | ICD-10-CM | POA: Diagnosis not present

## 2019-11-27 DIAGNOSIS — L570 Actinic keratosis: Secondary | ICD-10-CM | POA: Diagnosis not present

## 2019-11-27 DIAGNOSIS — L819 Disorder of pigmentation, unspecified: Secondary | ICD-10-CM | POA: Diagnosis not present

## 2019-11-27 DIAGNOSIS — D485 Neoplasm of uncertain behavior of skin: Secondary | ICD-10-CM | POA: Diagnosis not present

## 2019-11-27 DIAGNOSIS — L814 Other melanin hyperpigmentation: Secondary | ICD-10-CM | POA: Diagnosis not present

## 2019-11-27 DIAGNOSIS — D1801 Hemangioma of skin and subcutaneous tissue: Secondary | ICD-10-CM | POA: Diagnosis not present

## 2019-12-02 ENCOUNTER — Ambulatory Visit (HOSPITAL_COMMUNITY)
Admission: RE | Admit: 2019-12-02 | Discharge: 2019-12-02 | Disposition: A | Discharge status: Home / Self Care | Payer: BC Managed Care – PPO | Source: Ambulatory Visit | Attending: Cardiovascular Disease | Admitting: Cardiovascular Disease

## 2019-12-02 ENCOUNTER — Other Ambulatory Visit: Payer: Self-pay

## 2019-12-02 ENCOUNTER — Other Ambulatory Visit (HOSPITAL_COMMUNITY): Payer: Self-pay | Admitting: Family Medicine

## 2019-12-02 ENCOUNTER — Other Ambulatory Visit (HOSPITAL_COMMUNITY): Payer: Self-pay | Admitting: Specialist

## 2019-12-02 DIAGNOSIS — M79604 Pain in right leg: Secondary | ICD-10-CM

## 2019-12-02 DIAGNOSIS — M7989 Other specified soft tissue disorders: Secondary | ICD-10-CM

## 2019-12-02 DIAGNOSIS — M79605 Pain in left leg: Secondary | ICD-10-CM | POA: Diagnosis not present

## 2020-01-08 DIAGNOSIS — L570 Actinic keratosis: Secondary | ICD-10-CM | POA: Diagnosis not present

## 2020-01-09 DIAGNOSIS — H16139 Photokeratitis, unspecified eye: Secondary | ICD-10-CM | POA: Diagnosis not present

## 2020-01-09 DIAGNOSIS — M25561 Pain in right knee: Secondary | ICD-10-CM | POA: Diagnosis not present

## 2020-01-09 DIAGNOSIS — I872 Venous insufficiency (chronic) (peripheral): Secondary | ICD-10-CM | POA: Diagnosis not present

## 2020-02-12 DIAGNOSIS — L570 Actinic keratosis: Secondary | ICD-10-CM | POA: Diagnosis not present

## 2020-02-12 DIAGNOSIS — L819 Disorder of pigmentation, unspecified: Secondary | ICD-10-CM | POA: Diagnosis not present

## 2020-04-07 DIAGNOSIS — S90512A Abrasion, left ankle, initial encounter: Secondary | ICD-10-CM | POA: Diagnosis not present

## 2020-04-13 DIAGNOSIS — S83241D Other tear of medial meniscus, current injury, right knee, subsequent encounter: Secondary | ICD-10-CM | POA: Diagnosis not present

## 2020-04-13 DIAGNOSIS — R600 Localized edema: Secondary | ICD-10-CM | POA: Diagnosis not present

## 2020-05-17 ENCOUNTER — Other Ambulatory Visit: Payer: Self-pay | Admitting: *Deleted

## 2020-05-17 DIAGNOSIS — R600 Localized edema: Secondary | ICD-10-CM

## 2020-05-18 IMAGING — DX RIGHT KNEE - COMPLETE 4+ VIEW
4 series · 4 of 4 positions shown · non-contrast
Comparison: No prior.

CLINICAL DATA: Chronic knee pain.  Mild swelling.

EXAM:
RIGHT KNEE - COMPLETE 4+ VIEW

[knee ap]
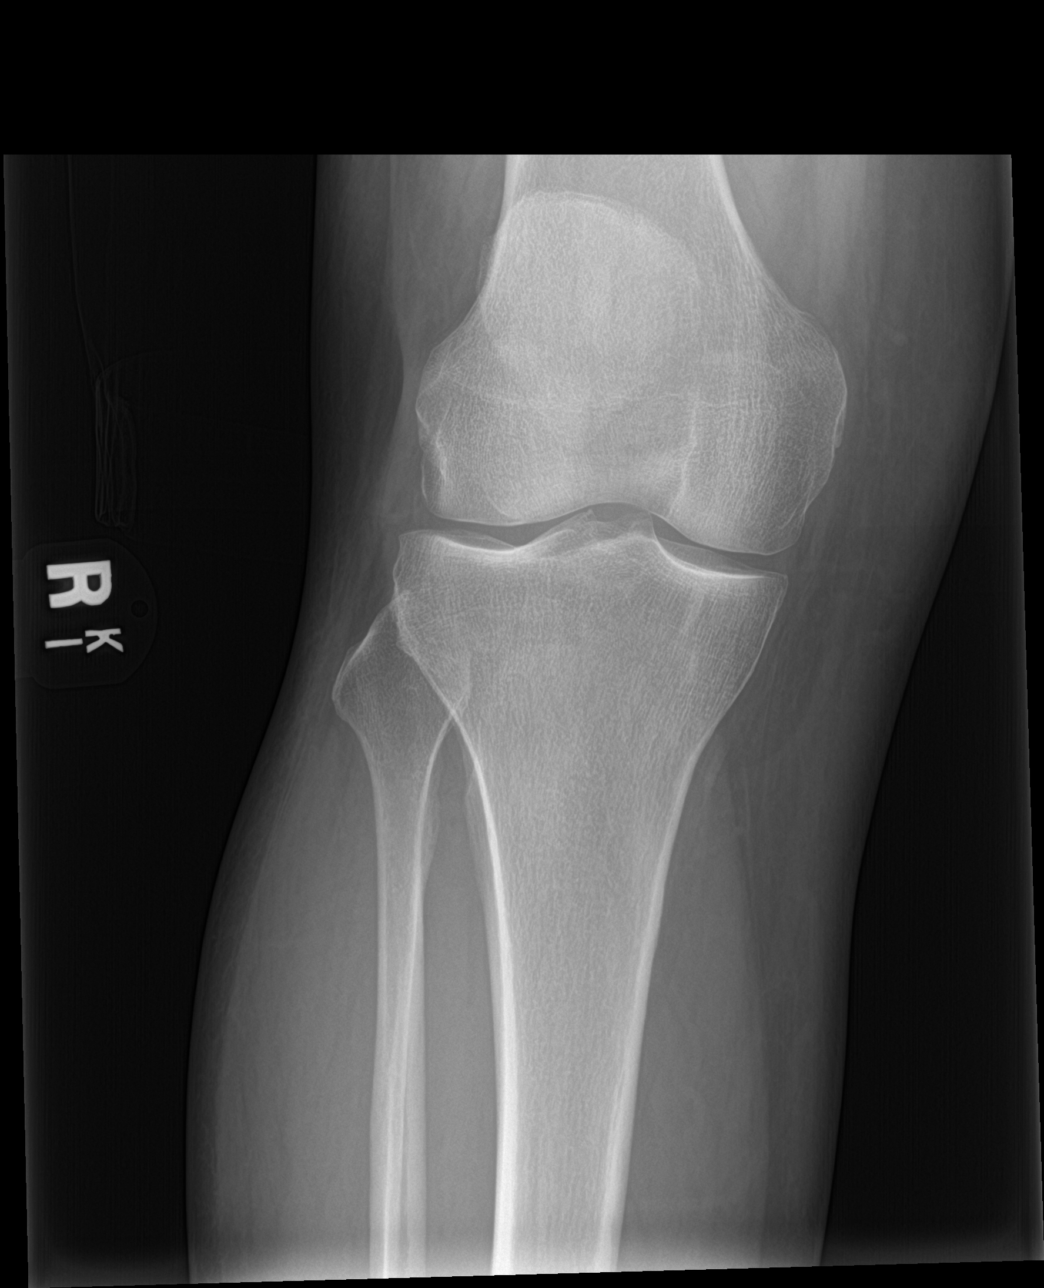

[knee tunnel]
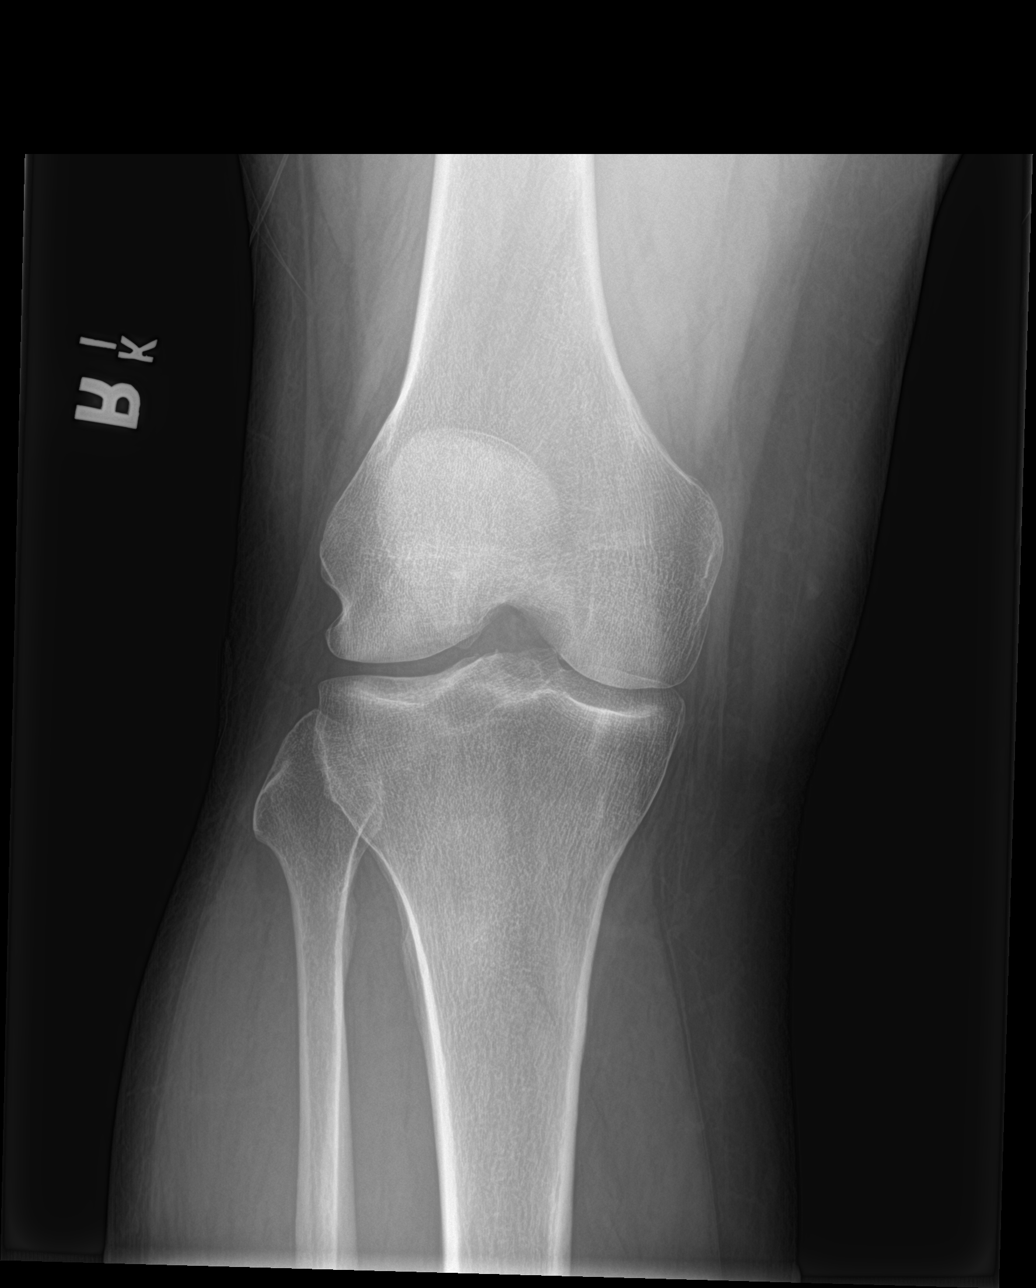

[knee lat]
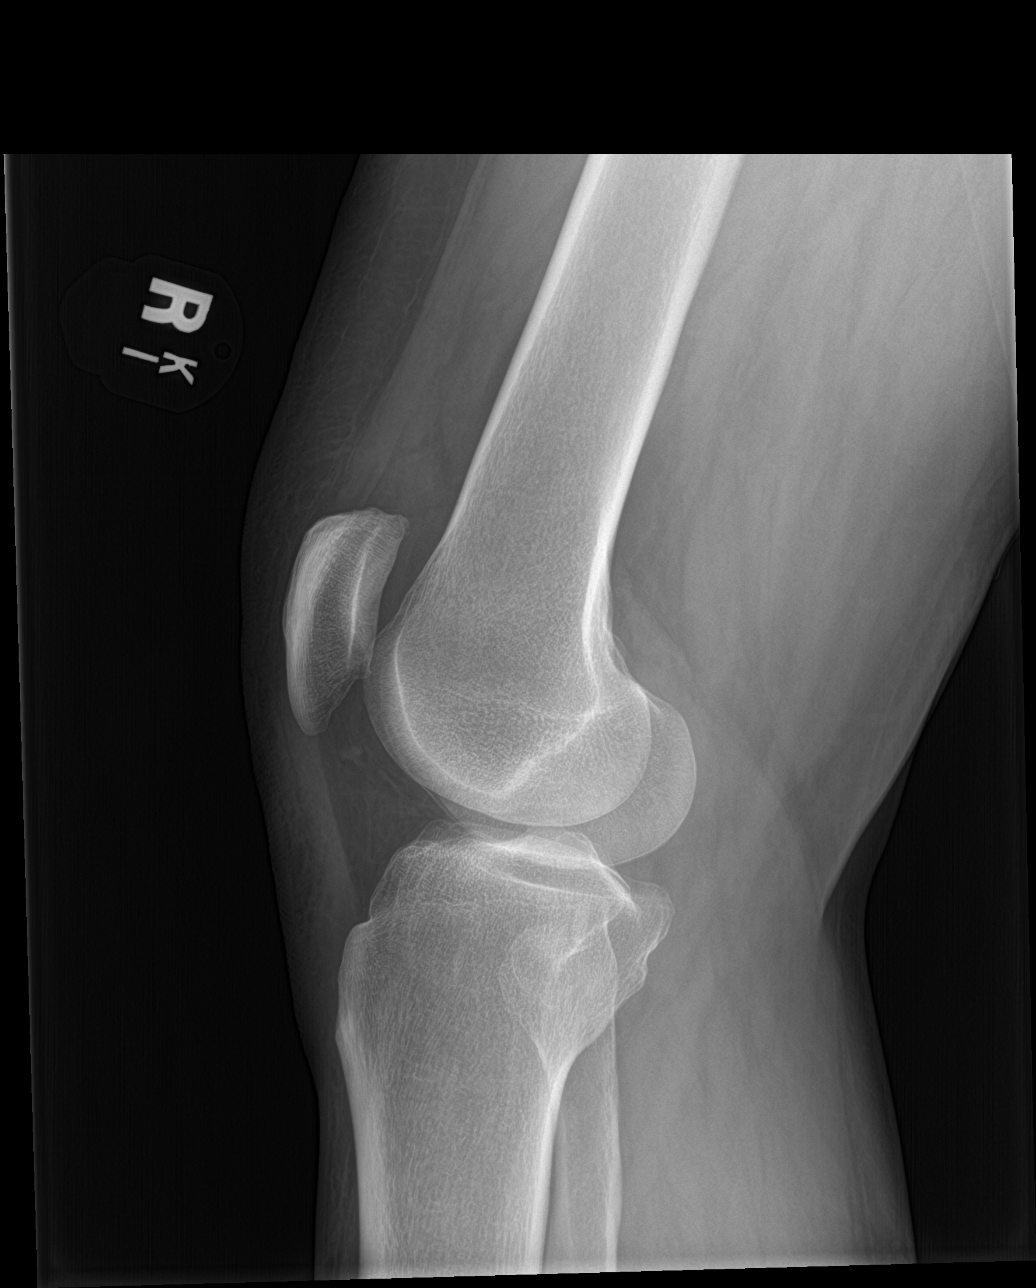

[sunrise]
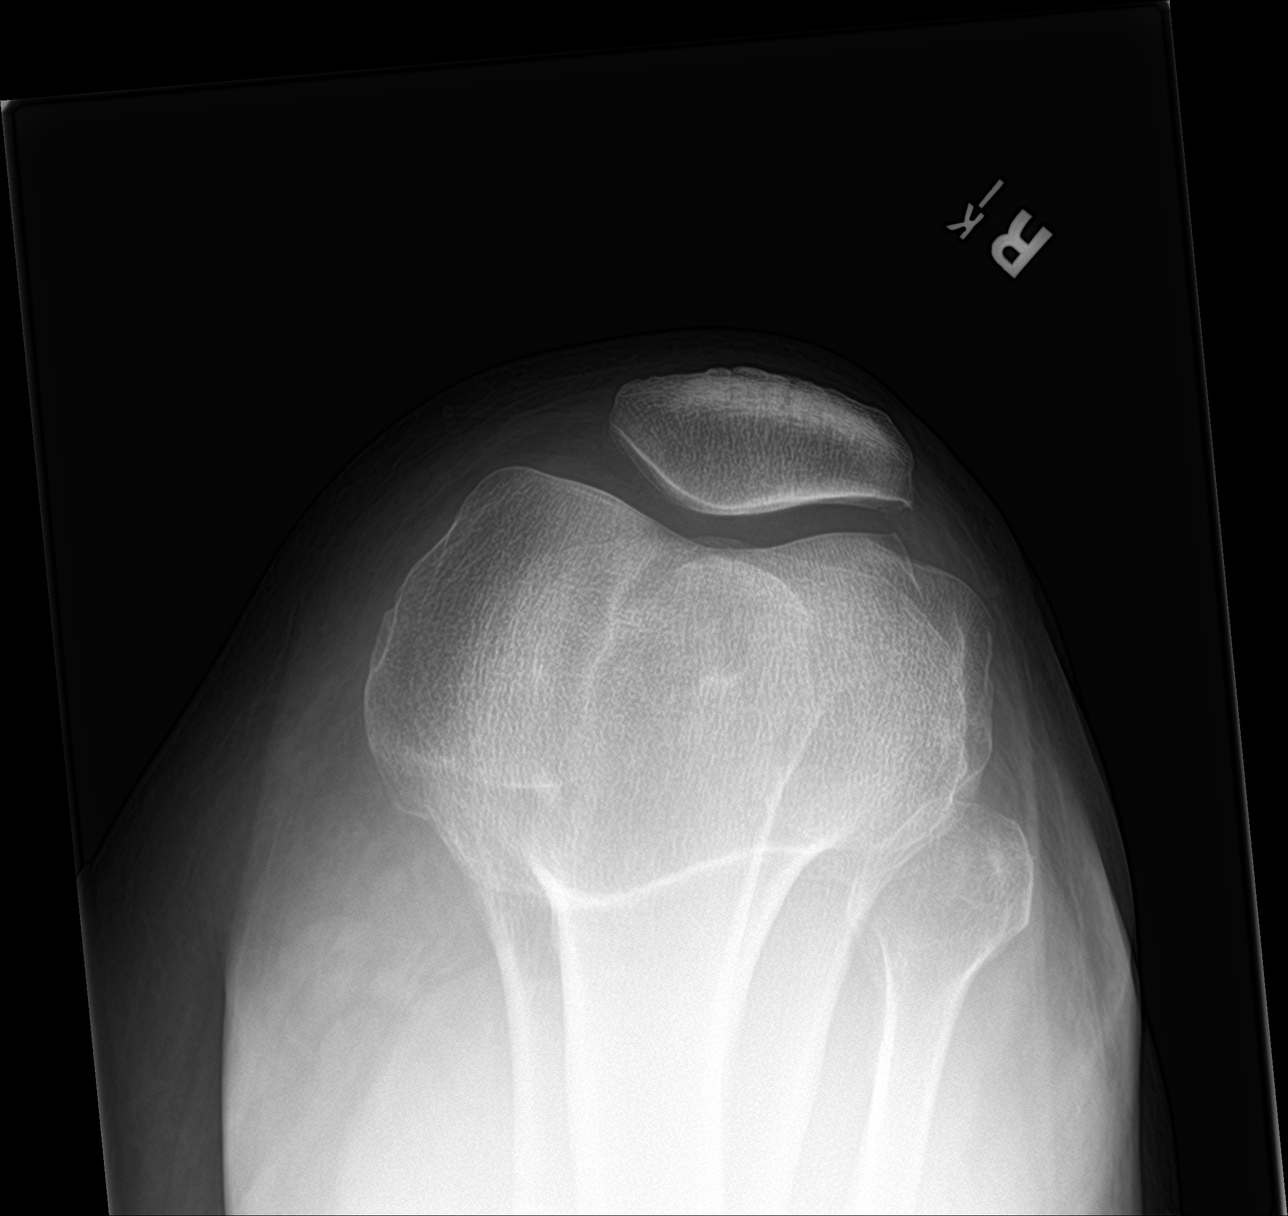

[4 of 4 positions shown; findings below may reference images not displayed]

FINDINGS: Mild patellofemoral degenerative change. No acute bony abnormality
identified. No evidence of fracture or dislocation.
IMPRESSION: Mild patellofemoral degenerative change.  No acute abnormality.

## 2020-05-26 ENCOUNTER — Other Ambulatory Visit: Payer: Self-pay

## 2020-05-26 ENCOUNTER — Ambulatory Visit (HOSPITAL_COMMUNITY)
Admission: RE | Admit: 2020-05-26 | Discharge: 2020-05-26 | Disposition: A | Discharge status: Home / Self Care | Payer: BC Managed Care – PPO | Source: Ambulatory Visit | Attending: Vascular Surgery | Admitting: Vascular Surgery

## 2020-05-26 ENCOUNTER — Ambulatory Visit (INDEPENDENT_AMBULATORY_CARE_PROVIDER_SITE_OTHER): Payer: BC Managed Care – PPO | Admitting: Physician Assistant

## 2020-05-26 VITALS — BP 146/94 | HR 89 | Temp 98.0°F | Ht 72.0 in | Wt 220.4 lb

## 2020-05-26 DIAGNOSIS — R600 Localized edema: Secondary | ICD-10-CM | POA: Diagnosis not present

## 2020-05-26 DIAGNOSIS — M7989 Other specified soft tissue disorders: Secondary | ICD-10-CM | POA: Diagnosis not present

## 2020-05-26 NOTE — Progress Notes (Signed)
VASCULAR & VEIN SPECIALISTS           OF [place]  History and Physical   Thomas Stone is a 54 y.o. male who presents with hx of left ankle swelling.  Pt states that right knee arthroscopy in May and did fine after that.  His mother in law noticed that he had significant swelling in his left ankle.  He was sent for a venous u/s and it was negative for DVT.  He continued to have some ankle swelling and saw his PCP, Cari Caraway and she sent him to be evaluated for venous insufficiency. He states he has been wearing knee high compression socks intermittently but more so now that it is colder weather.  He cannot determine if it has made a difference.  He states the swelling has gotten much better.  He does not have hx of DVT.  He does not have skin color changes in the lower leg.   He has hx of covid diagnosis on March 16, 2020.  He saw his PCP via tele-visit on 04/17/2020.   He did have significant amount of sx but was not admitted to the hospital.    The pt is on a statin for cholesterol management.  The pt is not on a daily aspirin.   Other AC:  none The pt is on ARB, BB for hypertension.   The pt is not diabetic.   Tobacco hx:  never   Past Medical History:  Diagnosis Date  . ED (erectile dysfunction)   . History of radiation therapy 08/19/09-10/06/09   prostate ,PSA 07/16/09=0.32  . Prostate cancer ([place]) 2008 dx   T3aNO adenocarcinoma,PSA 28.7  . Thrombocytosis ([place]) 2007 dx    Past Surgical History:  Procedure Laterality Date  . COLONOSCOPY     hx  . PROSTATE BIOPSY  02/17/09   gleason 3+4=7(6/22)bxs+adenocarcinoma  . PROSTATECTOMY  04/16/2009   robotic ,tumor right lobe    Social History   Socioeconomic History  . Marital status: Married    Spouse name: Not on file  . Number of children: Not on file  . Years of education: Not on file  . Highest education level: Not on file  Occupational History  . Not on file  Tobacco Use  . Smoking status: Never  Smoker  . Smokeless tobacco: Not on file  Substance and Sexual Activity  . Alcohol use: No  . Drug use: Not on file  . Sexual activity: Not on file  Other Topics Concern  . Not on file  Social History Narrative  . Not on file   Social Determinants of Health   Financial Resource Strain: Not on file  Food Insecurity: Not on file  Transportation Needs: Not on file  Physical Activity: Not on file  Stress: Not on file  Social Connections: Not on file  Intimate Partner Violence: Not on file     Family History  Problem Relation Age of Onset  . Prostate cancer Father        [id number],metastatic , also cabg/cad,  . Melanoma Paternal Grandmother 71       died 47    Current Outpatient Medications  Medication Sig Dispense Refill  . atorvastatin (LIPITOR) 20 MG tablet Take 20 mg by mouth daily.    Marland Kitchen CIALIS 5 MG tablet     . hydroxyurea (HYDREA) 500 MG capsule May take with food to minimize GI side effects. 1 on odd days\ 2  on even days 140 capsule 6  . losartan (COZAAR) 100 MG tablet Take 100 mg by mouth daily.    Marland Kitchen losartan (COZAAR) 25 MG tablet Take 25 mg by mouth daily.    . metoprolol succinate (TOPROL-XL) 50 MG 24 hr tablet Take 50 mg by mouth daily. Take with or immediately following a meal.    . predniSONE (DELTASONE) 50 MG tablet Take 1 tablet (50 mg total) by mouth daily. 5 tablet 0   No current facility-administered medications for this visit.    Allergies  Allergen Reactions  . Penicillins Other (See Comments)    Not documented in historical     REVIEW OF SYSTEMS:   [X]  denotes positive finding, [ ]  denotes negative finding Cardiac  Comments:  Chest pain or chest pressure:    Shortness of breath upon exertion:    Short of breath when lying flat:    Irregular heart rhythm:        Vascular    Pain in calf, thigh, or hip brought on by ambulation:    Pain in feet at night that wakes you up from your sleep:     Blood clot in your veins:    Leg swelling:  x        Pulmonary    Oxygen at home:    Productive cough:     Wheezing:         Neurologic    Sudden weakness in arms or legs:     Sudden numbness in arms or legs:     Sudden onset of difficulty speaking or slurred speech:    Temporary loss of vision in one eye:     Problems with dizziness:         Gastrointestinal    Blood in stool:     Vomited blood:         Genitourinary    Burning when urinating:     Blood in urine:        Psychiatric    Major depression:         Hematologic    Bleeding problems:    Problems with blood clotting too easily:        Skin    Rashes or ulcers:        Constitutional    Fever or chills:      PHYSICAL EXAMINATION:  Today's Vitals   05/26/20 1348  BP: (!) 146/94  Pulse: 89  Temp: 98 F (36.7 C)  TempSrc: Skin  SpO2: 97%  Weight: 220 lb 6.4 oz (100 kg)  Height: 6' (1.829 m)   Body mass index is 29.89 kg/m.   General:  WDWN in NAD; vital signs documented above Gait: Not observed HENT: WNL, normocephalic Pulmonary: normal non-labored breathing without wheezing Cardiac: regular HR; without carotid bruits Abdomen: soft, NT, no masses; aortic pulse is not palpable Skin: without rashes Vascular Exam/Pulses:  Right Left  Radial 2+ (normal) 2+ (normal)  DP 2+ (normal) 2+ (normal)  PT 2+ (normal) 2+ (normal)   Extremities: without ischemic changes, without cellulitis; without open wounds; without skin color changes Musculoskeletal: no muscle wasting or atrophy  Neurologic: A&O X 3;  moving all extremities equally Psychiatric:  The pt has Normal affect.   Non-Invasive Vascular Imaging:   Venous duplex on 05/26/2020: +---------------------+---------+------+-----------+------------+--------+  LEFT         Reflux NoRefluxReflux TimeDiameter cmsComments  Yes                   +---------------------+---------+------+-----------+------------+--------+  CFV          no                         +---------------------+---------+------+-----------+------------+--------+  FV mid        no                         +---------------------+---------+------+-----------+------------+--------+  Popliteal            yes  >1 second             +---------------------+---------+------+-----------+------------+--------+  GSV at [facility]      no               0.55        +---------------------+---------+------+-----------+------------+--------+  GSV prox thigh    no               0.54        +---------------------+---------+------+-----------+------------+--------+  GSV mid thigh    no               0.45        +---------------------+---------+------+-----------+------------+--------+  GSV dist thigh    no               0.41        +---------------------+---------+------+-----------+------------+--------+  GSV at knee           yes  >500 ms   0.40        +---------------------+---------+------+-----------+------------+--------+  GSV prox calf          yes  >500 ms   0.30        +---------------------+---------+------+-----------+------------+--------+  SSV Pop Fossa    no               0.31        +---------------------+---------+------+-----------+------------+--------+  SSV prox calf    no               0.38        +---------------------+---------+------+-----------+------------+--------+  SSV mid calf     no               0.22        +---------------------+---------+------+-----------+------------+--------+  Perforator (mid calf)      yes  >500 ms              +---------------------+---------+------+-----------+------------+--------+   Summary:    Left:  - No evidence of deep vein thrombosis seen in the left lower extremity,  from the common femoral through the popliteal veins.  - No evidence of superficial venous thrombosis in the left lower  extremity.    - Venous reflux is noted in the left greater saphenous vein at knee and in  the calf.  - Venous reflux is noted in the left popliteal vein.  - Venous reflux is noted in the left perforator vein (mid calf).    Dravon Monte Zinni is a 54 y.o. male who presents with: hx of left ankle swelling.  Pt left lower extremity swelling improved since original onset.  Venous duplex today reveals he does have some reflux in the GSV at the knee and calf as well as in the popliteal vein.  He is not a candidate for laser ablation.  -discussed with pt to continue wearing knee high compression socks and elevate legs daily if possible.  He was given  a handout.  -pt will f/u as needed.    Leontine Locket, [facility] Vascular and Vein Specialists 05/26/2020 1:17 PM  Clinic MD:  Scot Dock

## 2020-06-08 DIAGNOSIS — I10 Essential (primary) hypertension: Secondary | ICD-10-CM | POA: Diagnosis not present

## 2020-06-08 DIAGNOSIS — Z0000 Encounter for general adult medical examination without abnormal findings: Secondary | ICD-10-CM | POA: Diagnosis not present

## 2020-06-08 DIAGNOSIS — N5231 Erectile dysfunction following radical prostatectomy: Secondary | ICD-10-CM | POA: Diagnosis not present

## 2020-06-08 DIAGNOSIS — E7800 Pure hypercholesterolemia, unspecified: Secondary | ICD-10-CM | POA: Diagnosis not present

## 2020-06-08 DIAGNOSIS — D473 Essential (hemorrhagic) thrombocythemia: Secondary | ICD-10-CM | POA: Diagnosis not present

## 2020-07-29 DIAGNOSIS — L905 Scar conditions and fibrosis of skin: Secondary | ICD-10-CM | POA: Diagnosis not present

## 2020-07-29 DIAGNOSIS — L918 Other hypertrophic disorders of the skin: Secondary | ICD-10-CM | POA: Diagnosis not present

## 2020-07-29 DIAGNOSIS — L570 Actinic keratosis: Secondary | ICD-10-CM | POA: Diagnosis not present

## 2020-07-29 DIAGNOSIS — L821 Other seborrheic keratosis: Secondary | ICD-10-CM | POA: Diagnosis not present

## 2020-07-29 DIAGNOSIS — L819 Disorder of pigmentation, unspecified: Secondary | ICD-10-CM | POA: Diagnosis not present

## 2020-08-13 DIAGNOSIS — Z23 Encounter for immunization: Secondary | ICD-10-CM | POA: Diagnosis not present

## 2020-08-25 ENCOUNTER — Ambulatory Visit: Payer: BC Managed Care – PPO | Attending: Orthopedic Surgery

## 2020-08-25 ENCOUNTER — Other Ambulatory Visit: Payer: Self-pay

## 2020-08-25 DIAGNOSIS — I890 Lymphedema, not elsewhere classified: Secondary | ICD-10-CM | POA: Diagnosis not present

## 2020-08-25 DIAGNOSIS — R2242 Localized swelling, mass and lump, left lower limb: Secondary | ICD-10-CM | POA: Diagnosis not present

## 2020-08-25 NOTE — Therapy (Signed)
[place], [place], [address] Phone: [phone]   Fax:  [phone]  Physical Therapy Evaluation  Patient Details  Name: Thomas Stone MRN: [id number] Date of Birth: [birth year] Referring Provider (PT): Dr. Noemi Chapel   Encounter Date: 08/25/2020   PT End of Session - 08/25/20 1726    Visit Number 1    Number of Visits 1    PT Start Time 2482    PT Stop Time 1645    PT Time Calculation (min) 47 min    Activity Tolerance Patient tolerated treatment well    Behavior During Therapy [facility] for tasks assessed/performed           Past Medical History:  Diagnosis Date  . ED (erectile dysfunction)   . History of radiation therapy 08/19/09-10/06/09   prostate ,PSA 07/16/09=0.32  . Prostate cancer ([place]) 2008 dx   T3aNO adenocarcinoma,PSA 28.7  . Thrombocytosis 2007 dx    Past Surgical History:  Procedure Laterality Date  . COLONOSCOPY     hx  . PROSTATE BIOPSY  02/17/09   gleason 3+4=7(6/22)bxs+adenocarcinoma  . PROSTATECTOMY  04/16/2009   robotic ,tumor right lobe    There were no vitals filed for this visit.    Subjective Assessment - 08/25/20 1558    Subjective Had a torn meniscus in right knee 10/2019 and a week or 2 later he developed left ankle swelling. Screened for DVT and it was negative. Working at house last summer and he fell on his left knee.  No real injury.  [place] on a job site later on in the fall and injured lower leg and went to see Dr. Noemi Chapel. No injury. Dr. Noemi Chapel referred to Vascular surgery. Did another doppler with no DVT. He has been wearing some compression stockings, but he doesn't really care for them (maybe 20-30). Bought a compression sleeve for calf and didn't notice swelling.  Swelling reduces overnight and he does not notice any increase in swelling when he wears the stockings. Had prostatectomy in 2010 with localized radiation but does not think he had any LN's removed. The  doppler he had performed does indicate greater Saphenous Vein reflux in the popliteal and calf region. ([place] warning was called and eval interrupted to go to center of building.  Forgot to ask Abuse, Advanced directives, Medication info.)    Pertinent History Prostate CA 2010 with specific radiation, but unsure if LN's were removed.  doesn't think so.  Insidious onset of left ankle swelling with NKI.  Swelling is very mild.  Doppler revealed Greater Saphenous Vein Reflux in left LE    Patient Stated Goals Have leg swelling checked and determine if there is anything else he needs to do    Currently in Pain? No/denies    Multiple Pain Sites No              [facility] PT Assessment - 08/25/20 0001      Assessment   Medical Diagnosis Left LE swelling    Referring Provider (PT) Dr. Noemi Chapel    Onset Date/Surgical Date 11/03/19    Hand Dominance Right      Precautions   Precautions None      Restrictions   Weight Bearing Restrictions No      Balance Screen   Has the patient fallen in the past 6 months Yes    How many times? 1    Has the patient had a decrease in activity level because of a fear of  falling?  No    Is the patient reluctant to leave their home because of a fear of falling?  No      Home Environment   Living Environment Private residence    Living Arrangements Spouse/significant other    Available Help at Discharge Family      Prior Function   Level of Independence Independent    Vocation Full time employment    Vocation Requirements works in building supplies    Leisure golf,hiking      Cognition   Overall Cognitive Status Within [place] for tasks assessed      Observation/Other Assessments   Observations very mild swelling observed bilaterally, mild pitting edema bilaterally      Palpation   Palpation comment no tenderness             LYMPHEDEMA/ONCOLOGY QUESTIONNAIRE - 08/25/20 0001      Type   Cancer Type Prostate      Surgeries   Other  Surgery Date --   Prostate 2010     Treatment   Active Chemotherapy Treatment No    Past Chemotherapy Treatment No    Active Radiation Treatment No    Past Radiation Treatment Yes    Body Site prostate    Current Hormone Treatment No    Past Hormone Therapy No      What other symptoms do you have   Are you Having Heaviness or Tightness No    Are you having Pain No    Are you having pitting edema Yes    Body Site legs    Is it Hard or Difficult finding clothes that fit No    Do you have infections No    Is there Decreased scar mobility No    Stemmer Sign No      Lymphedema Stage   Stage STAGE 1 SPONTANEOUSLY REVERSIBLE      Lymphedema Assessments   Lymphedema Assessments Lower extremities      Right Lower Extremity Lymphedema   30 cm Proximal to Floor at Lateral Plantar Foot 38.6 cm    20 cm Proximal to Floor at Lateral Plantar Foot 27.5 1    10  cm Proximal to Floor at Lateral Malleoli 25.6 cm    5 cm Proximal to 1st MTP Joint 25 cm    Around Proximal Great Toe 8.7 cm      Left Lower Extremity Lymphedema   30 cm Proximal to Floor at Lateral Plantar Foot 40.1 cm    20 cm Proximal to Floor at Lateral Plantar Foot 29 cm    10 cm Proximal to Floor at Lateral Malleoli 26.1 cm    5 cm Proximal to 1st MTP Joint 23.4 cm    Around Proximal Great Toe 8.7 cm                   Objective measurements completed on examination: See above findings.               PT Education - 08/25/20 1716    Education Details Pt had multiple questions which were asked and answered today.  Discussed that if his leg reduces overnight as he indicates and the stocking holds the swelling there is nothing else he needs to do at this time. If swelling does not reduce overnight he may need to consider bandaging and if his stockings don't hold the swelling he may need to wrap/and or get stronger class stockings.  His swelling is very mild and  he seems to be managing it well.    Person(s)  Educated Patient    Methods Explanation    Comprehension Verbalized understanding            PT Short Term Goals - 08/25/20 1736      PT SHORT TERM GOAL #1   Title Pt will be educated in venous insuffieciency and will understand self care    Time 1    Period Days    Status Achieved    Target Date 08/25/20                     Plan - 08/25/20 1727    Clinical Impression Statement Pts legs were assessed and multiple questions were answered.  At the present his legs are reducing overnight and his compression stockings are holding the swelling. He has not been in stockings all day today and his swelling and pitting edema is very mild.  The left LE does measure slightly larger than the right.  I gave him the [employer] site in case he wants to order stockings.  I showed him how to check for pitting and advised him to check this occasionally in the morning when he first arises.  If swelling does not continue to reduce overnight or stockings do not hold swelling he may require bandaging/stronger stockings.  He was given my business card and advised to call with any qestions or concerns.    Personal Factors and Comorbidities Comorbidity 1;Comorbidity 2    Comorbidities prostate cancer, Greater Saphenous Vein Reflux left leg    Stability/Clinical Decision Making Stable/Uncomplicated    Clinical Decision Making Low    Rehab Potential Excellent    PT Frequency One time visit    PT Treatment/Interventions Patient/family education    PT Next Visit Plan Pt does not require further services at this time. He will call with any questions or concerns    PT Home Exercise Plan continue compression stockings    Consulted and Agree with Plan of Care Patient           Patient will benefit from skilled therapeutic intervention in order to improve the following deficits and impairments:  Increased edema  Visit Diagnosis: Localized swelling, mass and lump, left lower limb  Lymphedema,  not elsewhere classified     Problem List Patient Active Problem List   Diagnosis Date Noted  . Acute bursitis of left shoulder 06/13/2018  . Thigh numbness 04/10/2016  . Neck pain 03/02/2016  . Patellofemoral syndrome of both knees 03/02/2016  . Nonallopathic lesion of cervical region 03/02/2016  . Nonallopathic lesion of thoracic region 03/02/2016  . Nonallopathic lesion of lumbosacral region 03/02/2016  . Essential thrombocytosis ([place]) 11/08/2011  . Prostate cancer [facility]) 11/08/2011    Claris Pong 08/25/2020, 5:44 PM  [place], [place], [address] Phone: [phone]   Fax:  [phone]  Name: Thomas Stone MRN: [id number] Date of Birth: 08-17-1965 Cheral Almas, PT 08/25/20 5:45 PM

## 2020-09-12 DIAGNOSIS — R059 Cough, unspecified: Secondary | ICD-10-CM | POA: Diagnosis not present

## 2020-09-12 DIAGNOSIS — Z03818 Encounter for observation for suspected exposure to other biological agents ruled out: Secondary | ICD-10-CM | POA: Diagnosis not present

## 2020-09-12 DIAGNOSIS — Z20822 Contact with and (suspected) exposure to covid-19: Secondary | ICD-10-CM | POA: Diagnosis not present

## 2020-09-12 DIAGNOSIS — J0110 Acute frontal sinusitis, unspecified: Secondary | ICD-10-CM | POA: Diagnosis not present

## 2020-11-05 DIAGNOSIS — R972 Elevated prostate specific antigen [PSA]: Secondary | ICD-10-CM | POA: Diagnosis not present

## 2020-11-18 DIAGNOSIS — C61 Malignant neoplasm of prostate: Secondary | ICD-10-CM | POA: Diagnosis not present

## 2020-11-18 DIAGNOSIS — N529 Male erectile dysfunction, unspecified: Secondary | ICD-10-CM | POA: Diagnosis not present

## 2021-01-10 DIAGNOSIS — R600 Localized edema: Secondary | ICD-10-CM | POA: Diagnosis not present

## 2021-01-17 DIAGNOSIS — I8312 Varicose veins of left lower extremity with inflammation: Secondary | ICD-10-CM | POA: Diagnosis not present

## 2021-01-17 DIAGNOSIS — R600 Localized edema: Secondary | ICD-10-CM | POA: Diagnosis not present

## 2021-01-24 DIAGNOSIS — R600 Localized edema: Secondary | ICD-10-CM | POA: Diagnosis not present

## 2021-01-26 DIAGNOSIS — D485 Neoplasm of uncertain behavior of skin: Secondary | ICD-10-CM | POA: Diagnosis not present

## 2021-01-26 DIAGNOSIS — L821 Other seborrheic keratosis: Secondary | ICD-10-CM | POA: Diagnosis not present

## 2021-01-26 DIAGNOSIS — D225 Melanocytic nevi of trunk: Secondary | ICD-10-CM | POA: Diagnosis not present

## 2021-01-26 DIAGNOSIS — C4441 Basal cell carcinoma of skin of scalp and neck: Secondary | ICD-10-CM | POA: Diagnosis not present

## 2021-01-26 DIAGNOSIS — L905 Scar conditions and fibrosis of skin: Secondary | ICD-10-CM | POA: Diagnosis not present

## 2021-01-26 DIAGNOSIS — Z872 Personal history of diseases of the skin and subcutaneous tissue: Secondary | ICD-10-CM | POA: Diagnosis not present

## 2021-01-26 DIAGNOSIS — L570 Actinic keratosis: Secondary | ICD-10-CM | POA: Diagnosis not present

## 2021-01-31 ENCOUNTER — Other Ambulatory Visit: Payer: Self-pay | Admitting: Family Medicine

## 2021-01-31 DIAGNOSIS — M7989 Other specified soft tissue disorders: Secondary | ICD-10-CM

## 2021-02-18 ENCOUNTER — Ambulatory Visit
Admission: RE | Admit: 2021-02-18 | Discharge: 2021-02-18 | Disposition: A | Discharge status: Home / Self Care | Payer: BC Managed Care – PPO | Source: Ambulatory Visit | Attending: Family Medicine | Admitting: Family Medicine

## 2021-02-18 ENCOUNTER — Other Ambulatory Visit: Payer: Self-pay

## 2021-02-18 DIAGNOSIS — M7989 Other specified soft tissue disorders: Secondary | ICD-10-CM

## 2021-02-18 DIAGNOSIS — K7689 Other specified diseases of liver: Secondary | ICD-10-CM | POA: Diagnosis not present

## 2021-02-18 DIAGNOSIS — K429 Umbilical hernia without obstruction or gangrene: Secondary | ICD-10-CM | POA: Diagnosis not present

## 2021-02-18 MED ORDER — IOPAMIDOL (ISOVUE-370) INJECTION 76%
100.0000 mL | Freq: Once | INTRAVENOUS | Status: AC | PRN
  Administered 2021-02-18: 100 mL via INTRAVENOUS

## 2021-03-03 DIAGNOSIS — R600 Localized edema: Secondary | ICD-10-CM | POA: Diagnosis not present

## 2021-04-14 DIAGNOSIS — C4441 Basal cell carcinoma of skin of scalp and neck: Secondary | ICD-10-CM | POA: Diagnosis not present

## 2021-06-22 DIAGNOSIS — Z79899 Other long term (current) drug therapy: Secondary | ICD-10-CM | POA: Diagnosis not present

## 2021-06-22 DIAGNOSIS — E7800 Pure hypercholesterolemia, unspecified: Secondary | ICD-10-CM | POA: Diagnosis not present

## 2021-06-22 DIAGNOSIS — I10 Essential (primary) hypertension: Secondary | ICD-10-CM | POA: Diagnosis not present

## 2021-06-22 DIAGNOSIS — D473 Essential (hemorrhagic) thrombocythemia: Secondary | ICD-10-CM | POA: Diagnosis not present

## 2021-06-27 DIAGNOSIS — D473 Essential (hemorrhagic) thrombocythemia: Secondary | ICD-10-CM | POA: Diagnosis not present

## 2021-06-27 DIAGNOSIS — N5231 Erectile dysfunction following radical prostatectomy: Secondary | ICD-10-CM | POA: Diagnosis not present

## 2021-06-27 DIAGNOSIS — Z0000 Encounter for general adult medical examination without abnormal findings: Secondary | ICD-10-CM | POA: Diagnosis not present

## 2021-06-27 DIAGNOSIS — I10 Essential (primary) hypertension: Secondary | ICD-10-CM | POA: Diagnosis not present

## 2021-06-27 DIAGNOSIS — E7800 Pure hypercholesterolemia, unspecified: Secondary | ICD-10-CM | POA: Diagnosis not present

## 2021-08-01 DIAGNOSIS — L819 Disorder of pigmentation, unspecified: Secondary | ICD-10-CM | POA: Diagnosis not present

## 2021-08-01 DIAGNOSIS — D225 Melanocytic nevi of trunk: Secondary | ICD-10-CM | POA: Diagnosis not present

## 2021-08-01 DIAGNOSIS — L821 Other seborrheic keratosis: Secondary | ICD-10-CM | POA: Diagnosis not present

## 2021-08-01 DIAGNOSIS — L814 Other melanin hyperpigmentation: Secondary | ICD-10-CM | POA: Diagnosis not present

## 2021-08-05 DIAGNOSIS — M25562 Pain in left knee: Secondary | ICD-10-CM | POA: Diagnosis not present

## 2021-08-29 DIAGNOSIS — L578 Other skin changes due to chronic exposure to nonionizing radiation: Secondary | ICD-10-CM | POA: Diagnosis not present

## 2021-08-29 DIAGNOSIS — Z872 Personal history of diseases of the skin and subcutaneous tissue: Secondary | ICD-10-CM | POA: Diagnosis not present

## 2021-08-29 DIAGNOSIS — L570 Actinic keratosis: Secondary | ICD-10-CM | POA: Diagnosis not present

## 2021-08-29 DIAGNOSIS — C4441 Basal cell carcinoma of skin of scalp and neck: Secondary | ICD-10-CM | POA: Diagnosis not present

## 2021-08-29 DIAGNOSIS — Z08 Encounter for follow-up examination after completed treatment for malignant neoplasm: Secondary | ICD-10-CM | POA: Diagnosis not present

## 2021-08-29 DIAGNOSIS — L819 Disorder of pigmentation, unspecified: Secondary | ICD-10-CM | POA: Diagnosis not present

## 2021-09-02 DIAGNOSIS — M25562 Pain in left knee: Secondary | ICD-10-CM | POA: Diagnosis not present

## 2021-09-06 DIAGNOSIS — I499 Cardiac arrhythmia, unspecified: Secondary | ICD-10-CM | POA: Diagnosis not present

## 2021-09-13 ENCOUNTER — Ambulatory Visit (INDEPENDENT_AMBULATORY_CARE_PROVIDER_SITE_OTHER): Payer: BC Managed Care – PPO

## 2021-09-13 ENCOUNTER — Ambulatory Visit (INDEPENDENT_AMBULATORY_CARE_PROVIDER_SITE_OTHER): Payer: BC Managed Care – PPO | Admitting: Internal Medicine

## 2021-09-13 ENCOUNTER — Encounter: Payer: Self-pay | Admitting: Internal Medicine

## 2021-09-13 VITALS — BP 138/90 | HR 69 | Ht 71.0 in | Wt 222.4 lb

## 2021-09-13 DIAGNOSIS — R002 Palpitations: Secondary | ICD-10-CM

## 2021-09-13 NOTE — Progress Notes (Unsigned)
Enrolled for [employer] to mail a ZIO XT long term holter monitor to the patients address on file.  

## 2021-09-13 NOTE — Patient Instructions (Signed)
Medication Instructions:  ?No Changes In Medications at this time. ?*If you need a refill on your cardiac medications before your next appointment, please call your pharmacy* ? ?Testing/Procedures: ? ?ZIO XT- Long Term Monitor Instructions  ? ?Your physician has requested you wear your ZIO patch monitor___7____days.  ? ?This is a single patch monitor.  [employer] supplies one patch monitor per enrollment.  Additional stickers are not available. ?  ?Please do not apply patch if you will be having a Nuclear Stress Test, Echocardiogram, Cardiac CT, MRI, or Chest Xray during the time frame you would be wearing the monitor. The patch cannot be worn during these tests.  You cannot remove and re-apply the ZIO XT patch monitor. ?  ?Your ZIO patch monitor will be sent USPS Priority mail from [facility] directly to your home address. The monitor may also be mailed to a PO BOX if home delivery is not available.   It may take 3-5 days to receive your monitor after you have been enrolled. ?  ?Once you have received you monitor, please review enclosed instructions.  Your monitor has already been registered assigning a specific monitor serial # to you. ?  ?Applying the monitor  ? ?Shave hair from upper left chest. ?  ?Hold abrader disc by orange tab.  Rub abrader in 40 strokes over left upper chest as indicated in your monitor instructions. ?  ?Clean area with 4 enclosed alcohol pads .  Use all pads to assure are is cleaned thoroughly.  Let dry.  ? ?Apply patch as indicated in monitor instructions.  Patch will be place under collarbone on left side of chest with arrow pointing upward. ?  ?Rub patch adhesive wings for 2 minutes.Remove white label marked "1".  Remove white label marked "2".  Rub patch adhesive wings for 2 additional minutes. ?  ?While looking in a mirror, press and release button in center of patch.  A small green light will flash 3-4 times .  This will be your only indicator the monitor has been turned on. ?     ?Do not shower for the first 24 hours.  You may shower after the first 24 hours. ?  ?Press button if you feel a symptom. You will hear a small click.  Record Date, Time and Symptom in the Patient Log Book. ?  ?When you are ready to remove patch, follow instructions on last 2 pages of Patient Log Book.  Stick patch monitor onto last page of Patient Log Book. ?  ?Place Patient Log Book in [facility] box.  Use locking tab on box and tape box closed securely.  The [employer] and [employer] has [employer] on it.  Please place in mailbox as soon as possible.  Your physician should have your test results approximately 7 days after the monitor has been mailed back to [facility]. ?  ?Call [facility] at [phone] if you have questions regarding your ZIO XT patch monitor.  Call them immediately if you see an orange light blinking on your monitor. ?  ?If your monitor falls off in less than 4 days contact our Monitor department at [phone].  If your monitor becomes loose or falls off after 4 days call [employer] at [phone] for suggestions on securing your monitor.  ? ?Follow-Up: ?At [facility], you and your health needs are our priority.  As part of our continuing mission to provide you with exceptional heart care, we have created designated Provider Care Teams.  These  Care Teams include your primary Cardiologist (physician) and Advanced Practice Providers (APPs -  Physician Assistants and Nurse Practitioners) who all work together to provide you with the care you need, when you need it. ? ?Your next appointment:   ?AS NEEDED  ? ?The format for your next appointment:   ?In Person ? ?Provider:   ?Janina Mayo, MD   ? ? ?

## 2021-09-13 NOTE — Progress Notes (Signed)
?Cardiology Office Note:   ? ?Date:  09/13/2021  ? ?ID:  Thomas Stone, DOB [birth year], MRN [id number] ? ?PCP:  Cari Caraway, MD ?  ?[facility] Providers ?Cardiologist:  Janina Mayo, MD    ? ?Referring MD: Cari Caraway, MD  ? ?No chief complaint on file. ?Palpitations ? ?History of Present Illness:   ? ?Thomas Stone is a 56 y.o. male with a hx of prostate CA,  CAC 2018 92nd percentile 161, referral for palpitations ? ?Hie has no cardiac history. He saw a cardiologist in 2018 Dr. Georga Bora. He got a CAC score at that time. He was started on lipitor. He was discharged from the practice.He has not seen cardiology since. ? ?He notes his heart is fluttering. His wife teaches nursing. He occurs occasionally. He tried cutting back on caffeine. He notices it in the evenings. He has exercised. He thinks there is anxiety. He uses his heart watch and rates are noted to be sinus. No syncope. He denies chest pain or SOB. ? ?Blood pressure is in a good range. ? ?TSH is normal. ? ?For his prostate CA, he as seen by oncology. They planned to continue his hydrea with thrombocytosis. ? ?He's a non smoker. Mild etoh. ? ?Family hx- father had CABG at 45 years of age  No heart disease in his mother.. No SCD. ? ?Past Medical History:  ?Diagnosis Date  ? ED (erectile dysfunction)   ? History of radiation therapy 08/19/09-10/06/09  ? prostate ,PSA 07/16/09=0.32  ? Prostate cancer ([place]) 2008 dx  ? T3aNO adenocarcinoma,PSA 28.7  ? Thrombocytosis 2007 dx  ? ? ?Past Surgical History:  ?Procedure Laterality Date  ? COLONOSCOPY    ? hx  ? PROSTATE BIOPSY  02/17/09  ? gleason 3+4=7(6/22)bxs+adenocarcinoma  ? PROSTATECTOMY  04/16/2009  ? robotic ,tumor right lobe  ? ? ?Current Medications: ?Current Meds  ?Medication Sig  ? atorvastatin (LIPITOR) 20 MG tablet Take 20 mg by mouth daily.  ? hydroxyurea (HYDREA) 500 MG capsule May take with food to minimize GI side effects. ?1 on odd days\ ?2 on even days  ? losartan (COZAAR) 25 MG  tablet Take 25 mg by mouth daily.  ? metoprolol succinate (TOPROL-XL) 50 MG 24 hr tablet Take 50 mg by mouth daily. Take with or immediately following a meal.  ?  ? ?Allergies:   Penicillins  ? ?Social History  ? ?Socioeconomic History  ? Marital status: Married  ?  Spouse name: Not on file  ? Number of children: Not on file  ? Years of education: Not on file  ? Highest education level: Not on file  ?Occupational History  ? Not on file  ?Tobacco Use  ? Smoking status: Never  ? Smokeless tobacco: Never  ?Substance and Sexual Activity  ? Alcohol use: No  ? Drug use: Not on file  ? Sexual activity: Not on file  ?Other Topics Concern  ? Not on file  ?Social History Narrative  ? Not on file  ? ?Social Determinants of Health  ? ?Financial Resource Strain: Not on file  ?Food Insecurity: Not on file  ?Transportation Needs: Not on file  ?Physical Activity: Not on file  ?Stress: Not on file  ?Social Connections: Not on file  ?  ? ?Family History: ?The patient's family history includes Melanoma (age of onset: 85) in his paternal grandmother; Prostate cancer in his father. ? ?ROS:   ?Please see the history of present illness.    ? All  other systems reviewed and are negative. ? ?EKGs/Labs/Other Studies Reviewed:   ? ?The following studies were reviewed today: ? ? ?EKG:  EKG is  ordered today.  The ekg ordered today demonstrates  ? ?EKG 09/13/2021-[facility] ? ?Recent Labs: ?No results found for requested labs within last 8760 hours.  ?Recent Lipid Panel ?No results found for: CHOL, TRIG, HDL, CHOLHDL, VLDL, LDLCALC, LDLDIRECT ? ? ?Risk Assessment/Calculations:   ?  ? ?    ? ?Physical Exam:   ? ?VS:   ? ?Vitals:  ? 09/13/21 0934  ?BP: 138/90  ?Pulse: 69  ?SpO2: 96%  ? ? ? ?Wt Readings from Last 3 Encounters:  ?09/13/21 222 lb 6.4 oz (100.9 kg)  ?05/26/20 220 lb 6.4 oz (100 kg)  ?11/25/18 219 lb (99.3 kg)  ?  ? ?GEN:  Well nourished, well developed in no acute distress ?HEENT: Normal ?NECK: No JVD; No carotid bruits ?LYMPHATICS: No  lymphadenopathy ?CARDIAC: RRR, no murmurs, rubs, gallops ?RESPIRATORY:  Clear to auscultation without rales, wheezing or rhonchi  ?ABDOMEN: Soft, non-tender, non-distended ?MUSCULOSKELETAL:  No edema; No deformity  ?SKIN: Warm and dry ?NEUROLOGIC:  Alert and oriented x 3 ?PSYCHIATRIC:  Normal affect  ? ?ASSESSMENT:   ? ?Palpitations: He does not have high risk features including syncope c/f arrhythmia , family hx of SCD, or abnormalities on her EKG. She does not have high risk features including syncope c/f arrhythmia , family hx of SCD, or abnormalities on her EKGs.  Continue to cut back of caffeine. Recommended stress relief.  Will plan cardiac monitor to ensure no arrhythmia ? ? ?HLD: CAC 92nd percentile. Would recommend LDL closer to 70 mg/dL. On atorvastatin 20 mg daily. Can continue lipid profile with PCP and uptirate atorvastatin. If LDL not a goal with max atorvastatin, can add zetia. ? ?PLAN:   ? ?In order of problems listed above: ? ?7 day ziopatch ?Follow up as needed ? ?   ? ?Medication Adjustments/Labs and Tests Ordered: ?Current medicines are reviewed at length with the patient today.  Concerns regarding medicines are outlined above.  ?No orders of the defined types were placed in this encounter. ? ?No orders of the defined types were placed in this encounter. ? ? ?Patient Instructions  ?Medication Instructions:  ?No Changes In Medications at this time. ?*If you need a refill on your cardiac medications before your next appointment, please call your pharmacy* ? ?Testing/Procedures: ? ?ZIO XT- Long Term Monitor Instructions  ? ?Your physician has requested you wear your ZIO patch monitor___7____days.  ? ?This is a single patch monitor.  [employer] supplies one patch monitor per enrollment.  Additional stickers are not available. ?  ?Please do not apply patch if you will be having a Nuclear Stress Test, Echocardiogram, Cardiac CT, MRI, or Chest Xray during the time frame you would be wearing the monitor.  The patch cannot be worn during these tests.  You cannot remove and re-apply the ZIO XT patch monitor. ?  ?Your ZIO patch monitor will be sent USPS Priority mail from [facility] directly to your home address. The monitor may also be mailed to a PO BOX if home delivery is not available.   It may take 3-5 days to receive your monitor after you have been enrolled. ?  ?Once you have received you monitor, please review enclosed instructions.  Your monitor has already been registered assigning a specific monitor serial # to you. ?  ?Applying the monitor  ? ?Shave hair from upper left chest. ?  ?  Hold abrader disc by orange tab.  Rub abrader in 40 strokes over left upper chest as indicated in your monitor instructions. ?  ?Clean area with 4 enclosed alcohol pads .  Use all pads to assure are is cleaned thoroughly.  Let dry.  ? ?Apply patch as indicated in monitor instructions.  Patch will be place under collarbone on left side of chest with arrow pointing upward. ?  ?Rub patch adhesive wings for 2 minutes.Remove white label marked "1".  Remove white label marked "2".  Rub patch adhesive wings for 2 additional minutes. ?  ?While looking in a mirror, press and release button in center of patch.  A small green light will flash 3-4 times .  This will be your only indicator the monitor has been turned on. ?    ?Do not shower for the first 24 hours.  You may shower after the first 24 hours. ?  ?Press button if you feel a symptom. You will hear a small click.  Record Date, Time and Symptom in the Patient Log Book. ?  ?When you are ready to remove patch, follow instructions on last 2 pages of Patient Log Book.  Stick patch monitor onto last page of Patient Log Book. ?  ?Place Patient Log Book in [facility] box.  Use locking tab on box and tape box closed securely.  The [employer] has [employer] on it.  Please place in mailbox as soon as possible.  Your physician should have your test results approximately 7 days  after the monitor has been mailed back to [facility]. ?  ?Call [facility] at [phone] if you have questions regarding your ZIO XT patch monitor.  Call them immediately if you see an o

## 2021-09-17 DIAGNOSIS — R002 Palpitations: Secondary | ICD-10-CM

## 2021-12-30 ENCOUNTER — Encounter (HOSPITAL_BASED_OUTPATIENT_CLINIC_OR_DEPARTMENT_OTHER): Payer: Self-pay | Admitting: Orthopedic Surgery

## 2021-12-30 ENCOUNTER — Other Ambulatory Visit: Payer: Self-pay | Admitting: Orthopedic Surgery

## 2021-12-30 ENCOUNTER — Other Ambulatory Visit: Payer: Self-pay

## 2021-12-30 DIAGNOSIS — M79644 Pain in right finger(s): Secondary | ICD-10-CM | POA: Diagnosis not present

## 2021-12-30 DIAGNOSIS — L905 Scar conditions and fibrosis of skin: Secondary | ICD-10-CM | POA: Diagnosis not present

## 2021-12-30 DIAGNOSIS — S61220A Laceration with foreign body of right index finger without damage to nail, initial encounter: Secondary | ICD-10-CM | POA: Diagnosis not present

## 2022-01-04 NOTE — Progress Notes (Signed)

## 2022-01-05 ENCOUNTER — Encounter (HOSPITAL_BASED_OUTPATIENT_CLINIC_OR_DEPARTMENT_OTHER): Payer: Self-pay | Admitting: Orthopedic Surgery

## 2022-01-06 ENCOUNTER — Other Ambulatory Visit: Payer: Self-pay

## 2022-01-06 ENCOUNTER — Ambulatory Visit (HOSPITAL_BASED_OUTPATIENT_CLINIC_OR_DEPARTMENT_OTHER): Payer: BC Managed Care – PPO | Admitting: Anesthesiology

## 2022-01-06 ENCOUNTER — Encounter (HOSPITAL_BASED_OUTPATIENT_CLINIC_OR_DEPARTMENT_OTHER): Payer: Self-pay | Admitting: Orthopedic Surgery

## 2022-01-06 ENCOUNTER — Encounter (HOSPITAL_BASED_OUTPATIENT_CLINIC_OR_DEPARTMENT_OTHER)
Admission: RE | Disposition: A | Discharge status: Home / Self Care | Payer: Self-pay | Source: Home / Self Care | Attending: Orthopedic Surgery

## 2022-01-06 ENCOUNTER — Ambulatory Visit (HOSPITAL_BASED_OUTPATIENT_CLINIC_OR_DEPARTMENT_OTHER)
Admission: RE | Admit: 2022-01-06 | Discharge: 2022-01-06 | Disposition: A | Discharge status: Home / Self Care | Payer: BC Managed Care – PPO | Attending: Orthopedic Surgery | Admitting: Orthopedic Surgery

## 2022-01-06 DIAGNOSIS — L905 Scar conditions and fibrosis of skin: Secondary | ICD-10-CM | POA: Insufficient documentation

## 2022-01-06 DIAGNOSIS — W25XXXA Contact with sharp glass, initial encounter: Secondary | ICD-10-CM | POA: Diagnosis not present

## 2022-01-06 DIAGNOSIS — I10 Essential (primary) hypertension: Secondary | ICD-10-CM | POA: Diagnosis not present

## 2022-01-06 DIAGNOSIS — M60241 Foreign body granuloma of soft tissue, not elsewhere classified, right hand: Secondary | ICD-10-CM | POA: Insufficient documentation

## 2022-01-06 DIAGNOSIS — M795 Residual foreign body in soft tissue: Secondary | ICD-10-CM | POA: Diagnosis not present

## 2022-01-06 HISTORY — DX: Essential (primary) hypertension: I10

## 2022-01-06 HISTORY — PX: FOREIGN BODY REMOVAL: SHX962

## 2022-01-06 SURGERY — REMOVAL FOREIGN BODY EXTREMITY
Anesthesia: General | Site: Index Finger | Laterality: Right

## 2022-01-06 MED ORDER — PROMETHAZINE HCL 25 MG/ML IJ SOLN: 6.2500 mg | INTRAMUSCULAR | Status: DC | PRN

## 2022-01-06 MED ORDER — VANCOMYCIN HCL IN DEXTROSE 1-5 GM/200ML-% IV SOLN
INTRAVENOUS | Status: AC
  Filled 2022-01-06: qty 200

## 2022-01-06 MED ORDER — DIPHENHYDRAMINE HCL 50 MG/ML IJ SOLN
12.5000 mg | Freq: Once | INTRAMUSCULAR | Status: AC
  Administered 2022-01-06: 12.5 mg via INTRAVENOUS

## 2022-01-06 MED ORDER — 0.9 % SODIUM CHLORIDE (POUR BTL) OPTIME
TOPICAL | Status: DC | PRN
  Administered 2022-01-06: 1000 mL

## 2022-01-06 MED ORDER — ONDANSETRON HCL 4 MG/2ML IJ SOLN
INTRAMUSCULAR | Status: AC
  Filled 2022-01-06: qty 2

## 2022-01-06 MED ORDER — FENTANYL CITRATE (PF) 100 MCG/2ML IJ SOLN
INTRAMUSCULAR | Status: AC
  Filled 2022-01-06: qty 2

## 2022-01-06 MED ORDER — BUPIVACAINE HCL (PF) 0.25 % IJ SOLN
INTRAMUSCULAR | Status: DC | PRN
  Administered 2022-01-06: 9 mL

## 2022-01-06 MED ORDER — FENTANYL CITRATE (PF) 100 MCG/2ML IJ SOLN: 25.0000 ug | INTRAMUSCULAR | Status: DC | PRN

## 2022-01-06 MED ORDER — ACETAMINOPHEN 160 MG/5ML PO SOLN: 325.0000 mg | ORAL | Status: DC | PRN

## 2022-01-06 MED ORDER — MIDAZOLAM HCL 5 MG/5ML IJ SOLN
INTRAMUSCULAR | Status: DC | PRN
  Administered 2022-01-06: 2 mg via INTRAVENOUS

## 2022-01-06 MED ORDER — FENTANYL CITRATE (PF) 100 MCG/2ML IJ SOLN
INTRAMUSCULAR | Status: DC | PRN
Start: 2022-01-06 — End: 2022-01-06
  Administered 2022-01-06 (×2): 25 ug via INTRAVENOUS

## 2022-01-06 MED ORDER — DEXAMETHASONE SODIUM PHOSPHATE 10 MG/ML IJ SOLN
INTRAMUSCULAR | Status: DC | PRN
  Administered 2022-01-06: 5 mg via INTRAVENOUS

## 2022-01-06 MED ORDER — ACETAMINOPHEN 10 MG/ML IV SOLN: 1000.0000 mg | Freq: Once | INTRAVENOUS | Status: DC | PRN

## 2022-01-06 MED ORDER — AMISULPRIDE (ANTIEMETIC) 5 MG/2ML IV SOLN: 10.0000 mg | Freq: Once | INTRAVENOUS | Status: DC | PRN

## 2022-01-06 MED ORDER — HYDROCODONE-ACETAMINOPHEN 5-325 MG PO TABS: ORAL_TABLET | ORAL | 0 refills | Status: AC

## 2022-01-06 MED ORDER — LIDOCAINE HCL (CARDIAC) PF 100 MG/5ML IV SOSY
PREFILLED_SYRINGE | INTRAVENOUS | Status: DC | PRN
  Administered 2022-01-06: 40 mg via INTRAVENOUS

## 2022-01-06 MED ORDER — OXYCODONE HCL 5 MG PO TABS: 5.0000 mg | ORAL_TABLET | Freq: Once | ORAL | Status: DC | PRN

## 2022-01-06 MED ORDER — ONDANSETRON HCL 4 MG/2ML IJ SOLN
INTRAMUSCULAR | Status: DC | PRN
  Administered 2022-01-06: 4 mg via INTRAVENOUS

## 2022-01-06 MED ORDER — DIPHENHYDRAMINE HCL 50 MG/ML IJ SOLN
INTRAMUSCULAR | Status: AC
  Filled 2022-01-06: qty 1

## 2022-01-06 MED ORDER — OXYCODONE HCL 5 MG/5ML PO SOLN: 5.0000 mg | Freq: Once | ORAL | Status: DC | PRN

## 2022-01-06 MED ORDER — VANCOMYCIN HCL IN DEXTROSE 1-5 GM/200ML-% IV SOLN
1000.0000 mg | INTRAVENOUS | Status: AC
  Administered 2022-01-06: 1000 mg via INTRAVENOUS

## 2022-01-06 MED ORDER — PROPOFOL 10 MG/ML IV BOLUS
INTRAVENOUS | Status: DC | PRN
  Administered 2022-01-06: 200 mg via INTRAVENOUS

## 2022-01-06 MED ORDER — MIDAZOLAM HCL 2 MG/2ML IJ SOLN
INTRAMUSCULAR | Status: AC
  Filled 2022-01-06: qty 2

## 2022-01-06 MED ORDER — LACTATED RINGERS IV SOLN: INTRAVENOUS | Status: DC

## 2022-01-06 MED ORDER — ACETAMINOPHEN 325 MG PO TABS: 325.0000 mg | ORAL_TABLET | ORAL | Status: DC | PRN

## 2022-01-06 SURGICAL SUPPLY — 52 items
APL PRP STRL LF DISP 70% ISPRP (MISCELLANEOUS) ×2
BLADE MINI RND TIP GREEN BEAV (BLADE) IMPLANT
BLADE SURG 15 STRL LF DISP TIS (BLADE) ×6 IMPLANT
BLADE SURG 15 STRL SS (BLADE) ×6
BNDG CMPR 9X4 STRL LF SNTH (GAUZE/BANDAGES/DRESSINGS)
BNDG COHESIVE 1X5 TAN STRL LF (GAUZE/BANDAGES/DRESSINGS) ×2 IMPLANT
BNDG ELASTIC 2X5.8 VLCR STR LF (GAUZE/BANDAGES/DRESSINGS) IMPLANT
BNDG ELASTIC 3X5.8 VLCR STR LF (GAUZE/BANDAGES/DRESSINGS) IMPLANT
BNDG ESMARK 4X9 LF (GAUZE/BANDAGES/DRESSINGS) IMPLANT
BNDG GAUZE DERMACEA FLUFF (GAUZE/BANDAGES/DRESSINGS)
BNDG GAUZE DERMACEA FLUFF 4 (GAUZE/BANDAGES/DRESSINGS) IMPLANT
BNDG GZE DERMACEA 4 6PLY (GAUZE/BANDAGES/DRESSINGS)
CHLORAPREP W/TINT 26 (MISCELLANEOUS) ×4 IMPLANT
CORD BIPOLAR FORCEPS 12FT (ELECTRODE) IMPLANT
COVER BACK TABLE 60X90IN (DRAPES) ×4 IMPLANT
COVER MAYO STAND STRL (DRAPES) ×4 IMPLANT
CUFF TOURN SGL QUICK 18X4 (TOURNIQUET CUFF) ×4 IMPLANT
DRAPE EXTREMITY T 121X128X90 (DISPOSABLE) ×4 IMPLANT
DRAPE SURG 17X23 STRL (DRAPES) ×4 IMPLANT
GAUZE SPONGE 4X4 12PLY STRL (GAUZE/BANDAGES/DRESSINGS) ×4 IMPLANT
GAUZE XEROFORM 1X8 LF (GAUZE/BANDAGES/DRESSINGS) ×4 IMPLANT
GLOVE BIO SURGEON STRL SZ7.5 (GLOVE) ×4 IMPLANT
GLOVE BIOGEL PI IND STRL 8 (GLOVE) ×3 IMPLANT
GLOVE BIOGEL PI INDICATOR 8 (GLOVE) ×1
GOWN STRL REUS W/ TWL LRG LVL3 (GOWN DISPOSABLE) ×3 IMPLANT
GOWN STRL REUS W/TWL LRG LVL3 (GOWN DISPOSABLE) ×3
GOWN STRL REUS W/TWL XL LVL3 (GOWN DISPOSABLE) ×4 IMPLANT
LOOP VESSEL MAXI BLUE (MISCELLANEOUS) IMPLANT
NDL HYPO 25X1 1.5 SAFETY (NEEDLE) IMPLANT
NDL SAFETY ECLIPSE 18X1.5 (NEEDLE) IMPLANT
NEEDLE HYPO 18GX1.5 SHARP (NEEDLE)
NEEDLE HYPO 25X1 1.5 SAFETY (NEEDLE) ×3 IMPLANT
NS IRRIG 1000ML POUR BTL (IV SOLUTION) ×4 IMPLANT
PACK BASIN DAY SURGERY FS (CUSTOM PROCEDURE TRAY) ×4 IMPLANT
PAD CAST 3X4 CTTN HI CHSV (CAST SUPPLIES) IMPLANT
PADDING CAST ABS 4INX4YD NS (CAST SUPPLIES) ×1
PADDING CAST ABS COTTON 4X4 ST (CAST SUPPLIES) ×3 IMPLANT
PADDING CAST COTTON 3X4 STRL (CAST SUPPLIES)
SPLINT FINGER 3.25 911903 (SOFTGOODS) ×2 IMPLANT
SPLINT PLASTER CAST XFAST 3X15 (CAST SUPPLIES) IMPLANT
SPLINT PLASTER XTRA FASTSET 3X (CAST SUPPLIES)
STOCKINETTE 4X48 STRL (DRAPES) ×4 IMPLANT
STOCKINETTE 6  STRL (DRAPES) ×3
STOCKINETTE 6 STRL (DRAPES) ×1 IMPLANT
SUT ETHILON 3 0 PS 1 (SUTURE) IMPLANT
SUT ETHILON 4 0 PS 2 18 (SUTURE) ×2 IMPLANT
SWAB COLLECTION DEVICE MRSA (MISCELLANEOUS) IMPLANT
SWAB CULTURE ESWAB REG 1ML (MISCELLANEOUS) IMPLANT
SYR BULB EAR ULCER 3OZ GRN STR (SYRINGE) ×4 IMPLANT
SYR CONTROL 10ML LL (SYRINGE) ×2 IMPLANT
TOWEL GREEN STERILE FF (TOWEL DISPOSABLE) ×8 IMPLANT
UNDERPAD 30X36 HEAVY ABSORB (UNDERPADS AND DIAPERS) ×4 IMPLANT

## 2022-01-06 NOTE — Transfer of Care (Signed)
Immediate Anesthesia Transfer of Care Note  Patient: Thomas Stone  Procedure(s) Performed: right index excision foreign body granuloma with painful scar (Right: Index Finger)  Patient Location: PACU  Anesthesia Type:General  Level of Consciousness: awake, alert  and oriented  Airway & Oxygen Therapy: Patient Spontanous Breathing and Patient connected to face mask oxygen  Post-op Assessment: Report given to RN and Post -op Vital signs reviewed and stable  Post vital signs: Reviewed and stable  Last Vitals:  Vitals Value Taken Time  BP 146/85 01/06/22 1440  Temp    Pulse 58 01/06/22 1442  Resp 15 01/06/22 1442  SpO2 98 % 01/06/22 1442  Vitals shown include unvalidated device data.  Last Pain:  Vitals:   01/06/22 1215  TempSrc: Oral  PainSc: 0-No pain         Complications: No notable events documented.

## 2022-01-06 NOTE — Anesthesia Postprocedure Evaluation (Signed)
Anesthesia Post Note  Patient: Thomas Stone  Procedure(s) Performed: right index excision foreign body granuloma with painful scar (Right: Index Finger)     Patient location during evaluation: PACU Anesthesia Type: General Level of consciousness: awake and alert Pain management: pain level controlled Vital Signs Assessment: post-procedure vital signs reviewed and stable Respiratory status: spontaneous breathing, nonlabored ventilation, respiratory function stable and patient connected to nasal cannula oxygen Cardiovascular status: blood pressure returned to baseline and stable Postop Assessment: no apparent nausea or vomiting Anesthetic complications: no   No notable events documented.  Last Vitals:  Vitals:   01/06/22 1500 01/06/22 1520  BP: (!) 114/93 (!) 151/93  Pulse: 61 (!) 58  Resp: 15 16  Temp:  36.6 C  SpO2: 96% 98%    Last Pain:  Vitals:   01/06/22 1500  TempSrc:   PainSc: 0-No pain                 Effie Berkshire

## 2022-01-06 NOTE — Anesthesia Preprocedure Evaluation (Addendum)
Anesthesia Evaluation  Patient identified by MRN, date of birth, ID band Patient awake    Reviewed: Allergy & Precautions, NPO status , Patient's Chart, lab work & pertinent test results, reviewed documented beta blocker date and time   Airway Mallampati: II  TM Distance: >3 FB Neck ROM: Full    Dental  (+) Teeth Intact, Dental Advisory Given   Pulmonary neg pulmonary ROS,    breath sounds clear to auscultation       Cardiovascular hypertension, Pt. on medications and Pt. on home beta blockers  Rhythm:Regular Rate:Normal     Neuro/Psych negative neurological ROS     GI/Hepatic negative GI ROS, Neg liver ROS,   Endo/Other  negative endocrine ROS  Renal/GU negative Renal ROS     Musculoskeletal   Abdominal Normal abdominal exam  (+)   Peds  Hematology   Anesthesia Other Findings   Reproductive/Obstetrics                            Anesthesia Physical Anesthesia Plan  ASA: 2  Anesthesia Plan: General   Post-op Pain Management:    Induction: Intravenous  PONV Risk Score and Plan: 3 and Ondansetron, Dexamethasone and Midazolam  Airway Management Planned: LMA  Additional Equipment: None  Intra-op Plan:   Post-operative Plan: Extubation in OR  Informed Consent: I have reviewed the patients History and Physical, chart, labs and discussed the procedure including the risks, benefits and alternatives for the proposed anesthesia with the patient or authorized representative who has indicated his/her understanding and acceptance.     Dental advisory given  Plan Discussed with: CRNA  Anesthesia Plan Comments:         Anesthesia Quick Evaluation

## 2022-01-06 NOTE — Progress Notes (Signed)
Hives to arm appear to have faded. Patient denies any new symptoms. Vital signs stable. Dr. Smith Robert updated and states patient is ok to discharge home.

## 2022-01-06 NOTE — Anesthesia Procedure Notes (Signed)
Procedure Name: LMA Insertion Date/Time: 01/06/2022 2:08 PM  Performed by: Lavonia Dana, CRNAPre-anesthesia Checklist: Patient identified, Emergency Drugs available, Suction available and Patient being monitored Patient Re-evaluated:Patient Re-evaluated prior to induction Oxygen Delivery Method: Circle system utilized Preoxygenation: Pre-oxygenation with 100% oxygen Induction Type: IV induction Ventilation: Mask ventilation without difficulty LMA: LMA inserted LMA Size: 5.0 Number of attempts: 1 Airway Equipment and Method: Bite block Placement Confirmation: positive ETCO2 Tube secured with: Tape Dental Injury: Teeth and Oropharynx as per pre-operative assessment

## 2022-01-06 NOTE — Op Note (Signed)
NAME: Thomas Stone MEDICAL RECORD NO: [id number] DATE OF BIRTH: Jun 22, 1965 FAC[facility] LOCATION: [facility] PHYSICIAN: Tennis Must, MD   OPERATIVE REPORT   DATE OF PROCEDURE: 01/06/22    PREOPERATIVE DIAGNOSIS: Right index finger retained foreign body and painful scar   POSTOPERATIVE DIAGNOSIS: Right index finger retained foreign body and painful scar   PROCEDURE: Right index finger removal of retained foreign body with removal of scar/foreign body granuloma   SURGEON:  Leanora Cover, M.D.   ASSISTANT: none   ANESTHESIA:  General   INTRAVENOUS FLUIDS:  Per anesthesia flow sheet.   ESTIMATED BLOOD LOSS:  Minimal.   COMPLICATIONS:  None.   SPECIMENS:  none   TOURNIQUET TIME:    Total Tourniquet Time Documented: Forearm (Right) - 12 minutes Total: Forearm (Right) - 12 minutes    DISPOSITION:  Stable to PACU.   INDICATIONS: 56 year old male states he sustained a laceration to his right index finger from glass.  He has had continued pain at the dorsum of the finger.  He did not seek initial medical attention.  He wishes to have removal of any retained foreign body and excision of painful scar.  Risks, benefits and alternatives of surgery were discussed including the risks of blood loss, infection, damage to nerves, vessels, tendons, ligaments, bone for surgery, need for additional surgery, complications with wound healing, continued pain, stiffness.  He voiced understanding of these risks and elected to proceed.  OPERATIVE COURSE:  After being identified preoperatively by myself,  the patient and I agreed on the procedure and site of the procedure.  The surgical site was marked.  Surgical consent had been signed. Preoperative IV antibiotic prophylaxis was given. He was transferred to the operating room and placed on the operating table in supine position with the Right upper extremity on an arm board.  General anesthesia was induced by the  anesthesiologist.  Right upper extremity was prepped and draped in normal sterile orthopedic fashion.  A surgical pause was performed between the surgeons, anesthesia, and operating room staff and all were in agreement as to the patient, procedure, and site of procedure.  Tourniquet at the proximal aspect of the forearm was inflated to 250 mmHg after exsanguination of the arm with an Esmarch bandage.  Incision was made in a hockey-stick shape at the proximal edge of the swollen area of the finger.  This was carried in subcutaneous tissues by spreading technique.  A shard of glass was noted in the deep subcutaneous tissues.  This was removed.  There was surrounding scar and foreign body granuloma.  This was freed up with the scissors and removed.  There was some thickening to the extensor tendon at the ulnar side of the PIP joint.  This was not taken down.  The area was decreased in size after removal of the foreign body and subcutaneous scar.  The wound was copiously irrigated with sterile saline and closed with 4-0 nylon in a horizontal mattress fashion.  A digital block was performed with quarter percent plain Marcaine to aid in postoperative analgesia.  The wound was dressed with sterile Xeroform 4 x 4 and wrapped with a Coban dressing lightly.  An AlumaFoam splint was placed and wrapped lightly with Coban dressing.  The tourniquet was deflated at 12 minutes.  Fingertips were pink with brisk capillary refill after deflation of tourniquet.  The operative  drapes were broken down.  The patient was awoken from anesthesia safely.  He was transferred back  to the stretcher and taken to PACU in stable condition.  I will see him back in the office in 1 week for postoperative followup.  I will give him a prescription for Norco 5/325 1-2 tabs PO q6 hours prn pain, dispense # 15.   Leanora Cover, MD Electronically signed, 01/06/22

## 2022-01-06 NOTE — Progress Notes (Signed)
RN called to patient's room by patient's spouse. Hives noted to patient's left forearm (same extremity vancomycin preoperative IV antibiotic was infusing). Infusion discontinued. Dr. Fredna Dow made aware antibiotic stopped and no new orders received. Dr. Smith Robert made aware and order for benadryl received and administered. Patient is alert, oriented and in no acute distress. Patient denies itching, shortness of breath, or any additional symptoms at this time.

## 2022-01-06 NOTE — Discharge Instructions (Addendum)

## 2022-01-06 NOTE — H&P (Signed)
Thomas Stone is an 56 y.o. male.   Chief Complaint: painful scar/foreign body HPI: 56 yo male states he cut right index finger with glass 2 months ago.  Did not seek treatment.  Has a thickness on dorsum of finger and painful scar.  He wishes to have excision of any retained foreign body and possible excision of scar.  Allergies:  Allergies  Allergen Reactions   Penicillins Other (See Comments)    "Old allergy" Was just a rash per patient report    Past Medical History:  Diagnosis Date   ED (erectile dysfunction)    History of radiation therapy 08/19/09-10/06/09   prostate ,PSA 07/16/09=0.32   Hypertension    Prostate cancer ([place]) 2008 dx   T3aNO adenocarcinoma,PSA 28.7   Thrombocytosis 2007 dx    Past Surgical History:  Procedure Laterality Date   COLONOSCOPY     hx   KNEE ARTHROSCOPY Right 2020   [facility] BIOPSY  02/17/2009   gleason 3+4=7(6/22)bxs+adenocarcinoma   PROSTATECTOMY  04/16/2009   robotic ,tumor right lobe    Family History: Family History  Problem Relation Age of Onset   Prostate cancer Father        [id number],metastatic , also cabg/cad,   Melanoma Paternal Grandmother 76       died 27    Social History:   reports that he has never smoked. He has never used smokeless tobacco. He reports current alcohol use. He reports that he does not use drugs.  Medications: Medications Prior to Admission  Medication Sig Dispense Refill   atorvastatin (LIPITOR) 20 MG tablet Take 20 mg by mouth daily.     hydroxyurea (HYDREA) 500 MG capsule May take with food to minimize GI side effects. 1 on odd days\ 2 on even days 140 capsule 6   losartan (COZAAR) 100 MG tablet Take 100 mg by mouth daily.     metoprolol succinate (TOPROL-XL) 50 MG 24 hr tablet Take 25 mg by mouth daily. Take with or immediately following a meal.     CIALIS 5 MG tablet      losartan (COZAAR) 25 MG tablet Take 25 mg by mouth daily.      No results found for  this or any previous visit (from the past 48 hour(s)).  No results found.    Blood pressure (!) 135/91, pulse 71, temperature 97.8 F (36.6 C), temperature source Oral, resp. rate 16, height '5\' 11"'$  (1.803 m), weight 101.2 kg, SpO2 100 %.  General appearance: alert, cooperative, and appears stated age Head: Normocephalic, without obvious abnormality, atraumatic Neck: supple, symmetrical, trachea midline Extremities: Intact sensation and capillary refill all digits.  +epl/fpl/io.  No wounds.  Pulses: 2+ and symmetric Skin: Skin color, texture, turgor normal. No rashes or lesions Neurologic: Grossly normal Incision/Wound: none  Assessment/Plan Right index finger possible retained foreign body and painful scar.  Plan excision of any retained foreign body and possibly scar tissue with rotation flap as necessary.  Non operative and operative treatment options have been discussed with the patient and patient wishes to proceed with operative treatment. Risks, benefits, and alternatives of surgery have been discussed and the patient agrees with the plan of care.    Leanora Cover 01/06/2022, 1:43 PM

## 2022-01-09 ENCOUNTER — Encounter (HOSPITAL_BASED_OUTPATIENT_CLINIC_OR_DEPARTMENT_OTHER): Payer: Self-pay | Admitting: Orthopedic Surgery

## 2022-01-09 NOTE — Progress Notes (Signed)
Left message stating courtesy call and if any questions or concerns please call the doctors office.  

## 2022-03-06 DIAGNOSIS — C61 Malignant neoplasm of prostate: Secondary | ICD-10-CM | POA: Diagnosis not present

## 2022-03-10 NOTE — Progress Notes (Signed)
[facility] [place] Phone: [phone] Subjective:   Thomas Stone, am serving as a scribe for Dr. Hulan Saas.  I'm seeing this patient by the request  of:  Cari Caraway, MD  CC: swollen ankle   [id number]  Thomas Stone is a 56 y.o. male coming in with complaint of L ankle pain. Last seen in Nov 2020. Patient states that his pain is deep in ankle joint. Stone pattern to his pain. Was told years ago that he has venous insufficiency. Played golf one week ago without pain but ankle swelled up. Was at [facility] and did a lot of walking and his foot did not swell. Wears compression socks and a compression ankle brace which helps.        Past Medical History:  Diagnosis Date   ED (erectile dysfunction)    History of radiation therapy 08/19/09-10/06/09   prostate ,PSA 07/16/09=0.32   Hypertension    Prostate cancer ([place]) 2008 dx   T3aNO adenocarcinoma,PSA 28.7   Thrombocytosis 2007 dx   Past Surgical History:  Procedure Laterality Date   COLONOSCOPY     hx   FOREIGN BODY REMOVAL Right 01/06/2022   Procedure: right index excision foreign body granuloma with painful scar;  Surgeon: Leanora Cover, MD;  Location: [facility];  Service: Orthopedics;  Laterality: Right;   KNEE ARTHROSCOPY Right [place] BIOPSY  02/17/2009   gleason 3+4=7(6/22)bxs+adenocarcinoma   PROSTATECTOMY  04/16/2009   robotic ,tumor right lobe   Social History   Socioeconomic History   Marital status: Married    Spouse name: Not on file   Number of children: Not on file   Years of education: Not on file   Highest education level: Not on file  Occupational History   Not on file  Tobacco Use   Smoking status: Never   Smokeless tobacco: Never  Vaping Use   Vaping Use: Never used  Substance and Sexual Activity   Alcohol use: Yes    Comment: weekly   Drug use: Never   Sexual activity:  Not on file  Other Topics Concern   Not on file  Social History Narrative   Not on file   Social Determinants of Health   Financial Resource Strain: Not on file  Food Insecurity: Not on file  Transportation Needs: Not on file  Physical Activity: Not on file  Stress: Not on file  Social Connections: Not on file   Allergies  Allergen Reactions   Penicillins Other (See Comments)    "Old allergy" Was just a rash per patient report   Vancomycin Hives   Family History  Problem Relation Age of Onset   Prostate cancer Father        [id number],metastatic , also cabg/cad,   Melanoma Paternal Grandmother 53       died 28     Current Outpatient Medications (Cardiovascular):    atorvastatin (LIPITOR) 20 MG tablet, Take 20 mg by mouth daily.   losartan (COZAAR) 25 MG tablet, Take 25 mg by mouth daily.   metoprolol succinate (TOPROL-XL) 50 MG 24 hr tablet, Take 25 mg by mouth daily. Take with or immediately following a meal.   Current Outpatient Medications (Analgesics):    HYDROcodone-acetaminophen (NORCO/VICODIN) 5-325 MG tablet, 1-2 tabs PO q6 hours prn pain   Current Outpatient Medications (Other):    hydroxyurea (HYDREA) 500 MG capsule, May take with food  to minimize GI side effects. 1 on odd days\ 2 on even days   Reviewed prior external information including notes and imaging from  primary care provider As well as notes that were available from care everywhere and other healthcare systems.  Past medical history, social, surgical and family history all reviewed in electronic medical record.  Stone pertanent information unless stated regarding to the chief complaint.   Review of Systems:  Stone headache, visual changes, nausea, vomiting, diarrhea, constipation, dizziness, abdominal pain, skin rash, fevers, chills, night sweats, weight loss, swollen lymph nodes, body aches, joint swelling, chest pain, shortness of breath, mood changes. POSITIVE muscle aches  Objective  Blood  pressure 122/82, pulse 95, height '5\' 11"'$  (1.803 m), weight 220 lb (99.8 kg), SpO2 99 %.   General: Stone apparent distress alert and oriented x3 mood and affect normal, dressed appropriately.  HEENT: Pupils equal, extraocular movements intact  Respiratory: Patient's speak in full sentences and does not appear short of breath  Cardiovascular: Stone lower extremity edema, non tender, Stone erythema  Ankle exam shows patient does have some mild swelling noted.  Seems to be at the sinus tarsi.  Patient's ATFL minorly tender to palpation.  Stone edema noted of the lower extremities.  Limited muscular skeletal ultrasound was performed and interpreted by Hulan Saas, M  Patient does have some hypoechoic changes noted at the sinus tarsi.  Mild may be narrowing noted.  ATFL does appear to be intact.  Stone cortical irregularity of the talar dome noted. Impression: Likely sinus tarsi syndrome  97110; 15 additional minutes spent for Therapeutic exercises as stated in above notes.  This included exercises focusing on stretching, strengthening, with significant focus on eccentric aspects.   Long term goals include an improvement in range of motion, strength, endurance as well as avoiding reinjury. Patient's frequency would include in 1-2 times a day, 3-5 times a week for a duration of 6-12 weeks.  Ankle strengthening that included:  Basic range of motion exercises to allow proper full motion at ankle Stretching of the lower leg and hamstrings  Theraband exercises for the lower leg - inversion, eversion, dorsiflexion and plantarflexion each to be completed with a theraband Balance exercises to increase proprioception Weight bearing exercises to increase strength and balance Proper technique shown and discussed handout in great detail with ATC.  All questions were discussed and answered.     Impression and Recommendations:     The above documentation has been reviewed and is accurate and complete Lyndal Pulley,  DO

## 2022-03-13 ENCOUNTER — Ambulatory Visit (INDEPENDENT_AMBULATORY_CARE_PROVIDER_SITE_OTHER): Payer: BC Managed Care – PPO | Admitting: Family Medicine

## 2022-03-13 ENCOUNTER — Ambulatory Visit (INDEPENDENT_AMBULATORY_CARE_PROVIDER_SITE_OTHER): Payer: BC Managed Care – PPO

## 2022-03-13 ENCOUNTER — Encounter: Payer: Self-pay | Admitting: Family Medicine

## 2022-03-13 VITALS — BP 122/82 | HR 95 | Ht 71.0 in | Wt 220.0 lb

## 2022-03-13 DIAGNOSIS — M25572 Pain in left ankle and joints of left foot: Secondary | ICD-10-CM | POA: Diagnosis not present

## 2022-03-13 NOTE — Patient Instructions (Signed)
Exercises Heel lift Wear compression brace with instability exercises Xray on way out See me again in 6-8 weeks

## 2022-03-13 NOTE — Assessment & Plan Note (Signed)
Sinus tarsi syndrome.  Discussed HEP, compression, heel lift, recovery sandals.  Xray pending to rule out any type of bony abnormality that could be contributing patient will follow-up again in 4 to 8 weeks.

## 2022-03-16 DIAGNOSIS — C61 Malignant neoplasm of prostate: Secondary | ICD-10-CM | POA: Diagnosis not present

## 2022-03-16 DIAGNOSIS — N529 Male erectile dysfunction, unspecified: Secondary | ICD-10-CM | POA: Diagnosis not present

## 2022-04-20 NOTE — Progress Notes (Unsigned)
Thomas Stone Thomas Stone Phone: [phone] Subjective:   Thomas Stone, am serving as a scribe for Dr. Hulan Saas.  I'm seeing this patient by the request  of:  Cari Caraway, MD  CC: Left ankle pain  [id number]  03/13/2022 Sinus tarsi syndrome.  Discussed HEP, compression, heel lift, recovery sandals.  Xray pending to rule out any type of bony abnormality that could be contributing patient will follow-up again in 4 to 8 weeks.      Update 04/24/2022 Thomas Stone is a 56 y.o. male coming in with complaint of L ankle pain. Patient states that his ankle is doing much better.    Patient did have x-rays at last exam that were independently visualized by me showing Stone significant bony abnormality  Past Medical History:  Diagnosis Date   ED (erectile dysfunction)    History of radiation therapy 08/19/09-10/06/09   prostate ,PSA 07/16/09=0.32   Hypertension    Prostate cancer ([place]) 2008 dx   T3aNO adenocarcinoma,PSA 28.7   Thrombocytosis 2007 dx   Past Surgical History:  Procedure Laterality Date   COLONOSCOPY     hx   FOREIGN BODY REMOVAL Right 01/06/2022   Procedure: right index excision foreign body granuloma with painful scar;  Surgeon: Leanora Cover, MD;  Location: [facility];  Service: Orthopedics;  Laterality: Right;   KNEE ARTHROSCOPY Right [place] BIOPSY  02/17/2009   gleason 3+4=7(6/22)bxs+adenocarcinoma   PROSTATECTOMY  04/16/2009   robotic ,tumor right lobe   Social History   Socioeconomic History   Marital status: Married    Spouse name: Not on file   Number of children: Not on file   Years of education: Not on file   Highest education level: Not on file  Occupational History   Not on file  Tobacco Use   Smoking status: Never   Smokeless tobacco: Never  Vaping Use   Vaping Use: Never used  Substance and Sexual Activity   Alcohol  use: Yes    Comment: weekly   Drug use: Never   Sexual activity: Not on file  Other Topics Concern   Not on file  Social History Narrative   Not on file   Social Determinants of Health   Financial Resource Strain: Not on file  Food Insecurity: Not on file  Transportation Needs: Not on file  Physical Activity: Not on file  Stress: Not on file  Social Connections: Not on file   Allergies  Allergen Reactions   Penicillins Other (See Comments)    "Old allergy" Was just a rash per patient report   Vancomycin Hives   Family History  Problem Relation Age of Onset   Prostate cancer Father        [id number],metastatic , also cabg/cad,   Melanoma Paternal Grandmother 29       died 84     Current Outpatient Medications (Cardiovascular):    atorvastatin (LIPITOR) 20 MG tablet, Take 20 mg by mouth daily.   losartan (COZAAR) 25 MG tablet, Take 25 mg by mouth daily.   metoprolol succinate (TOPROL-XL) 50 MG 24 hr tablet, Take 25 mg by mouth daily. Take with or immediately following a meal.   Current Outpatient Medications (Analgesics):    HYDROcodone-acetaminophen (NORCO/VICODIN) 5-325 MG tablet, 1-2 tabs PO q6 hours prn pain   Current Outpatient Medications (Other):    hydroxyurea (HYDREA) 500 MG capsule, May take  with food to minimize GI side effects. 1 on odd days\ 2 on even days   Reviewed prior external information including notes and imaging from  primary care provider As well as notes that were available from care everywhere and other healthcare systems.  Past medical history, social, surgical and family history all reviewed in electronic medical record.  Stone pertanent information unless stated regarding to the chief complaint.   Review of Systems:  Stone headache, visual changes, nausea, vomiting, diarrhea, constipation, dizziness, abdominal pain, skin rash, fevers, chills, night sweats, weight loss, swollen lymph nodes, body aches, joint swelling, chest pain, shortness of  breath, mood changes. POSITIVE muscle aches  Objective  Blood pressure 112/80, pulse 69, height '5\' 11"'$  (1.803 m), weight 222 lb (100.7 kg), SpO2 99 %.   General: Stone apparent distress alert and oriented x3 mood and affect normal, dressed appropriately.  HEENT: Pupils equal, extraocular movements intact  Respiratory: Patient's speak in full sentences and does not appear short of breath  Cardiovascular: Stone lower extremity edema, non tender, Stone erythema  Left ankle exam shows the patient does have improvement in strength noted.  Still some mild tightness noted to the posterior cord.  Nontender on exam.  Stone significant effusion of the ankle noted.  Nontender over the sinus tarsi.   Limited muscular skeletal ultrasound was performed and interpreted by Hulan Saas, M  Limited ultrasound of patient's left ankle shows that there is just a trace hypoechoic changes around the sinus tarsi but otherwise fairly unremarkable.  Patient does have ATFL does appear to be intact Impression: resolved swelling     Impression and Recommendations:    The above documentation has been reviewed and is accurate and complete Lyndal Pulley, DO

## 2022-04-24 ENCOUNTER — Encounter: Payer: Self-pay | Admitting: Family Medicine

## 2022-04-24 ENCOUNTER — Ambulatory Visit (INDEPENDENT_AMBULATORY_CARE_PROVIDER_SITE_OTHER): Payer: BC Managed Care – PPO | Admitting: Family Medicine

## 2022-04-24 ENCOUNTER — Ambulatory Visit: Payer: Self-pay

## 2022-04-24 VITALS — BP 112/80 | HR 69 | Ht 71.0 in | Wt 222.0 lb

## 2022-04-24 DIAGNOSIS — M25572 Pain in left ankle and joints of left foot: Secondary | ICD-10-CM | POA: Diagnosis not present

## 2022-04-24 NOTE — Assessment & Plan Note (Addendum)
Completely resolved at moment.  Discussed HEP as well as compression as needed.  Patient can follow-up on an as-needed basis.

## 2022-04-24 NOTE — Patient Instructions (Signed)
More than happy to talk with your daughter Exercises 2x a week  See me when you need me

## 2022-04-25 DIAGNOSIS — D2272 Melanocytic nevi of left lower limb, including hip: Secondary | ICD-10-CM | POA: Diagnosis not present

## 2022-04-25 DIAGNOSIS — D225 Melanocytic nevi of trunk: Secondary | ICD-10-CM | POA: Diagnosis not present

## 2022-04-25 DIAGNOSIS — Z1283 Encounter for screening for malignant neoplasm of skin: Secondary | ICD-10-CM | POA: Diagnosis not present

## 2022-04-25 DIAGNOSIS — D485 Neoplasm of uncertain behavior of skin: Secondary | ICD-10-CM | POA: Diagnosis not present

## 2022-05-17 DIAGNOSIS — L988 Other specified disorders of the skin and subcutaneous tissue: Secondary | ICD-10-CM | POA: Diagnosis not present

## 2022-05-17 DIAGNOSIS — D485 Neoplasm of uncertain behavior of skin: Secondary | ICD-10-CM | POA: Diagnosis not present

## 2022-06-29 DIAGNOSIS — S61412A Laceration without foreign body of left hand, initial encounter: Secondary | ICD-10-CM | POA: Diagnosis not present

## 2022-07-03 DIAGNOSIS — D473 Essential (hemorrhagic) thrombocythemia: Secondary | ICD-10-CM | POA: Diagnosis not present

## 2022-07-03 DIAGNOSIS — E7800 Pure hypercholesterolemia, unspecified: Secondary | ICD-10-CM | POA: Diagnosis not present

## 2022-07-03 DIAGNOSIS — I10 Essential (primary) hypertension: Secondary | ICD-10-CM | POA: Diagnosis not present

## 2022-07-10 DIAGNOSIS — Z0000 Encounter for general adult medical examination without abnormal findings: Secondary | ICD-10-CM | POA: Diagnosis not present

## 2022-07-10 DIAGNOSIS — Z23 Encounter for immunization: Secondary | ICD-10-CM | POA: Diagnosis not present

## 2022-09-13 NOTE — Progress Notes (Unsigned)
[facility] [address] [place] [address] Phone: [phone] Subjective:   Thomas Stone, am serving as a scribe for Dr. Antoine Primas.  I'm seeing this patient by the request  of:  Gweneth Dimitri, MD  CC: Sent to buttocks pain  [id number]  Thomas Stone is a 57 y.o. male coming in with complaint of coccyx pain. Last seen in November 2023 for ankle pain. Patient states that his tail bone hurts and has no idea how that has happened. Pain comes and goes and 100% correlates with what kind of chair he is in. To make it better all he has to do is stand up.        Past Medical History:  Diagnosis Date   ED (erectile dysfunction)    History of radiation therapy 08/19/09-10/06/09   prostate ,PSA 07/16/09=0.32   Hypertension    Prostate cancer ([facility]) 2008 dx   T3aNO adenocarcinoma,PSA 28.7   Thrombocytosis 2007 dx   Past Surgical History:  Procedure Laterality Date   COLONOSCOPY     hx   FOREIGN BODY REMOVAL Right 01/06/2022   Procedure: right index excision foreign body granuloma with painful scar;  Surgeon: Betha Loa, MD;  Location: [facility];  Service: Orthopedics;  Laterality: Right;   KNEE ARTHROSCOPY Right 2020   [facility]   PROSTATE BIOPSY  02/17/2009   gleason 3+4=7(6/22)bxs+adenocarcinoma   PROSTATECTOMY  04/16/2009   robotic ,tumor right lobe   Social History   Socioeconomic History   Marital status: Married    Spouse name: Not on file   Number of children: Not on file   Years of education: Not on file   Highest education level: Not on file  Occupational History   Not on file  Tobacco Use   Smoking status: Never   Smokeless tobacco: Never  Vaping Use   Vaping Use: Never used  Substance and Sexual Activity   Alcohol use: Yes    Comment: weekly   Drug use: Never   Sexual activity: Not on file  Other Topics Concern   Not on file  Social History Narrative   Not on file    Social Determinants of Health   Financial Resource Strain: Not on file  Food Insecurity: Not on file  Transportation Needs: Not on file  Physical Activity: Not on file  Stress: Not on file  Social Connections: Not on file   Allergies  Allergen Reactions   Penicillins Other (See Comments)    "Old allergy" Was just a rash per patient report   Vancomycin Hives   Family History  Problem Relation Age of Onset   Prostate cancer Father        [id number],metastatic , also cabg/cad,   Melanoma Paternal Grandmother 61       died 34     Current Outpatient Medications (Cardiovascular):    atorvastatin (LIPITOR) 20 MG tablet, Take 20 mg by mouth daily.   losartan (COZAAR) 25 MG tablet, Take 25 mg by mouth daily.   metoprolol succinate (TOPROL-XL) 50 MG 24 hr tablet, Take 25 mg by mouth daily. Take with or immediately following a meal.   Current Outpatient Medications (Analgesics):    HYDROcodone-acetaminophen (NORCO/VICODIN) 5-325 MG tablet, 1-2 tabs PO q6 hours prn pain   Current Outpatient Medications (Other):    hydroxyurea (HYDREA) 500 MG capsule, May take with food to minimize GI side effects. 1 on odd days\ 2 on even days  Reviewed prior external information including notes and imaging from  primary care provider As well as notes that were available from care everywhere and other healthcare systems.  Past medical history, social, surgical and family history all reviewed in electronic medical record.  No pertanent information unless stated regarding to the chief complaint.   Review of Systems:  No headache, visual changes, nausea, vomiting, diarrhea, constipation, dizziness, abdominal pain, skin rash, fevers, chills, night sweats, weight loss, swollen lymph nodes, body aches, joint swelling, chest pain, shortness of breath, mood changes. POSITIVE muscle aches  Objective  Blood pressure 122/84, pulse 68, height 5\' 11"  (1.803 m), weight 224 lb (101.6 kg), SpO2 97 %.    General: No apparent distress alert and oriented x3 mood and affect normal, dressed appropriately.  HEENT: Pupils equal, extraocular movements intact  Respiratory: Patient's speak in full sentences and does not appear short of breath  Cardiovascular: No lower extremity edema, non tender, no erythema  Low back exam shows some very mild loss of lordosis.  Patient does have some relatively poor core strength.  Tightness noted patient is tender to palpation over the coccyx bone.  No crepitus noted, no masses noted.   97110; 15 additional minutes spent for Therapeutic exercises as stated in above notes.  This included exercises focusing on stretching, strengthening, with significant focus on eccentric aspects.   Long term goals include an improvement in range of motion, strength, endurance as well as avoiding reinjury. Patient's frequency would include in 1-2 times a day, 3-5 times a week for a duration of 6-12 weeks. Low back exercises that included:  Pelvic tilt/bracing instruction to focus on control of the pelvic girdle and lower abdominal muscles  Glute strengthening exercises, focusing on proper firing of the glutes without engaging the low back muscles Proper stretching techniques for maximum relief for the hamstrings, hip flexors, low back and some rotation where tolerated  Proper technique shown and discussed handout in great detail with ATC.  All questions were discussed and answered.     Impression and Recommendations:    The above documentation has been reviewed and is accurate and complete [name], DO

## 2022-09-14 ENCOUNTER — Ambulatory Visit (INDEPENDENT_AMBULATORY_CARE_PROVIDER_SITE_OTHER): Payer: BC Managed Care – PPO

## 2022-09-14 ENCOUNTER — Ambulatory Visit (INDEPENDENT_AMBULATORY_CARE_PROVIDER_SITE_OTHER): Payer: BC Managed Care – PPO | Admitting: Family Medicine

## 2022-09-14 ENCOUNTER — Encounter: Payer: Self-pay | Admitting: Family Medicine

## 2022-09-14 VITALS — BP 122/84 | HR 68 | Ht 71.0 in | Wt 224.0 lb

## 2022-09-14 DIAGNOSIS — M47816 Spondylosis without myelopathy or radiculopathy, lumbar region: Secondary | ICD-10-CM | POA: Diagnosis not present

## 2022-09-14 DIAGNOSIS — M533 Sacrococcygeal disorders, not elsewhere classified: Secondary | ICD-10-CM

## 2022-09-14 DIAGNOSIS — M25572 Pain in left ankle and joints of left foot: Secondary | ICD-10-CM

## 2022-09-14 DIAGNOSIS — M549 Dorsalgia, unspecified: Secondary | ICD-10-CM | POA: Diagnosis not present

## 2022-09-14 NOTE — Assessment & Plan Note (Signed)
Discussed with patient again.  Discussed with worsening symptoms can consider the possibility of injections.  Patient understands and wants to continue with conservative therapy.  Nothing that stopping him from activity.  No worsening symptoms and if anything he does believe it is improving.  Follow-up again in 3 months for further evaluation if needed.

## 2022-09-14 NOTE — Assessment & Plan Note (Signed)
Patient does not remember any trauma, discussed with patient about strengthening exercises.  Work with Event organiser.  In addition to this though we did discuss the possibility that there is a bone be more superficial.  Patient does have a history of prostate cancer so we will get a x-ray but I do not think this is significantly low likelihood with that being greater than 10 years ago.  Discussed which activities to do and which ones to avoid exercise wise and proper sleeping position.  Follow-up again in 2 months

## 2022-09-14 NOTE — Patient Instructions (Signed)
Good to see you  Dg lumbar and pelvis on your way out Exercises given  When painful 3 ibuprofen 3 times a day for 3 days Tell church to get you a good cushion  Follow up in 2 months

## 2022-11-09 NOTE — Progress Notes (Unsigned)
[facility] [address] [place] [address] Phone: [phone] Subjective:   Thomas Stone, am serving as a scribe for Dr. Antoine Primas.  I'm seeing this patient by the request  of:  Gweneth Dimitri, MD  CC: Low back and left ankle pain  [id number]  09/14/2022 Patient does not remember any trauma, discussed with patient about strengthening exercises.  Work with Event organiser.  In addition to this though we did discuss the possibility that there is a bone be more superficial.  Patient does have a history of prostate cancer so we will get a x-ray but I do not think this is significantly low likelihood with that being greater than 10 years ago.  Discussed which activities to do and which ones to avoid exercise wise and proper sleeping position.  Follow-up again in 2 months     Discussed with patient again.  Discussed with worsening symptoms can consider the possibility of injections.  Patient understands and wants to continue with conservative therapy.  Nothing that stopping him from activity.  No worsening symptoms and if anything he does believe it is improving.  Follow-up again in 3 months for further evaluation if needed.      Update 11/14/2022 Thomas Stone is a 57 y.o. male coming in with complaint of coccyx and L ankle pain. Patient states that he continues to have pain in coccyx. Pain dependent upon what chair he is sitting in. Once he stands up pressure is relieved.   L ankle pain still occurring as well. Diligent about doing ankle HEP but is not seeing a difference. Ankle is worse when active.        Past Medical History:  Diagnosis Date   ED (erectile dysfunction)    History of radiation therapy 08/19/09-10/06/09   prostate ,PSA 07/16/09=0.32   Hypertension    Prostate cancer (HCC) 2008 dx   T3aNO adenocarcinoma,PSA 28.7   Thrombocytosis 2007 dx   Past Surgical History:  Procedure Laterality Date   COLONOSCOPY     hx    FOREIGN BODY REMOVAL Right 01/06/2022   Procedure: right index excision foreign body granuloma with painful scar;  Surgeon: Betha Loa, MD;  Location: [facility];  Service: Orthopedics;  Laterality: Right;   KNEE ARTHROSCOPY Right 2020   [facility]   PROSTATE BIOPSY  02/17/2009   gleason 3+4=7(6/22)bxs+adenocarcinoma   PROSTATECTOMY  04/16/2009   robotic ,tumor right lobe   Social History   Socioeconomic History   Marital status: Married    Spouse name: Not on file   Number of children: Not on file   Years of education: Not on file   Highest education level: Not on file  Occupational History   Not on file  Tobacco Use   Smoking status: Never   Smokeless tobacco: Never  Vaping Use   Vaping Use: Never used  Substance and Sexual Activity   Alcohol use: Yes    Comment: weekly   Drug use: Never   Sexual activity: Not on file  Other Topics Concern   Not on file  Social History Narrative   Not on file   Social Determinants of Health   Financial Resource Strain: Not on file  Food Insecurity: Not on file  Transportation Needs: Not on file  Physical Activity: Not on file  Stress: Not on file  Social Connections: Not on file   Allergies  Allergen Reactions   Penicillins Other (See Comments)    "  Old allergy" Was just a rash per patient report   Vancomycin Hives   Family History  Problem Relation Age of Onset   Prostate cancer Father        [id number],metastatic , also cabg/cad,   Melanoma Paternal Grandmother 28       died 67     Current Outpatient Medications (Cardiovascular):    atorvastatin (LIPITOR) 20 MG tablet, Take 20 mg by mouth daily.   losartan (COZAAR) 25 MG tablet, Take 25 mg by mouth daily.   metoprolol succinate (TOPROL-XL) 50 MG 24 hr tablet, Take 25 mg by mouth daily. Take with or immediately following a meal.   Current Outpatient Medications (Analgesics):    HYDROcodone-acetaminophen (NORCO/VICODIN) 5-325 MG  tablet, 1-2 tabs PO q6 hours prn pain   Current Outpatient Medications (Other):    hydroxyurea (HYDREA) 500 MG capsule, May take with food to minimize GI side effects. 1 on odd days\ 2 on even days   Reviewed prior external information including notes and imaging from  primary care provider As well as notes that were available from care everywhere and other healthcare systems.  Past medical history, social, surgical and family history all reviewed in electronic medical record.  No pertanent information unless stated regarding to the chief complaint.   Review of Systems:  No headache, visual changes, nausea, vomiting, diarrhea, constipation, dizziness, abdominal pain, skin rash, fevers, chills, night sweats, weight loss, swollen lymph nodes, body aches, chest pain, shortness of breath, mood changes. POSITIVE muscle aches, joint swelling  Objective  Blood pressure 122/88, pulse 70, height 5\' 11"  (1.803 m), weight 200 lb (90.7 kg), SpO2 98 %.   General: No apparent distress alert and oriented x3 mood and affect normal, dressed appropriately.  HEENT: Pupils equal, extraocular movements intact  Respiratory: Patient's speak in full sentences and does not appear short of breath  Low back exam shows mild loss of lordosis noted.  Some tenderness to palpation of the paraspinal musculature.  Left ankle exam shows mild edema of the ankle joint noted.  Mostly seems to be swelling of the sinus tarsi, very mild loss of dorsiflexion and 5 degrees  Procedure: Real-time Ultrasound Guided Injection of left sinus tarsi Device: GE Logiq Q7 Ultrasound guided injection is preferred based studies that show increased duration, increased effect, greater accuracy, decreased procedural pain, increased response rate, and decreased cost with ultrasound guided versus blind injection.  Verbal informed consent obtained.  Time-out conducted.  Noted no overlying erythema, induration, or other signs of local infection.   Skin prepped in a sterile fashion.  Local anesthesia: Topical Ethyl chloride.  With sterile technique and under real time ultrasound guidance: With a 25-gauge half inch needle injected with 0.5 cc of 0.5% Marcaine and 0.5 cc of Kenalog 40 mg/mL Completed without difficulty  Pain immediately improved suggesting accurate placement of the medication.  Advised to call if fevers/chills, erythema, induration, drainage, or persistent bleeding.  Impression: Technically successful ultrasound guided injection.   Impression and Recommendations:      The above documentation has been reviewed and is accurate and complete [name], DO

## 2022-11-14 ENCOUNTER — Ambulatory Visit (INDEPENDENT_AMBULATORY_CARE_PROVIDER_SITE_OTHER): Payer: BC Managed Care – PPO | Admitting: Family Medicine

## 2022-11-14 ENCOUNTER — Encounter: Payer: Self-pay | Admitting: Family Medicine

## 2022-11-14 ENCOUNTER — Other Ambulatory Visit: Payer: Self-pay

## 2022-11-14 VITALS — BP 122/88 | HR 70 | Ht 71.0 in | Wt 200.0 lb

## 2022-11-14 DIAGNOSIS — M25572 Pain in left ankle and joints of left foot: Secondary | ICD-10-CM | POA: Diagnosis not present

## 2022-11-14 DIAGNOSIS — G8929 Other chronic pain: Secondary | ICD-10-CM

## 2022-11-14 NOTE — Patient Instructions (Addendum)
Injected L ankle today Find a pillow for your butt Have f/u in 2-3 months

## 2022-11-14 NOTE — Assessment & Plan Note (Signed)
Patient given injection today and tolerated the procedure well, discussed icing regimen and home exercises, discussed which activities to do and which ones to avoid.  Increase activity slowly over the course of next several weeks.  We discussed compression when needed.  Worsening symptoms could consider the possibility of advanced imaging to make sure there is no OCD that could be contributing.  Follow-up again in 2 months

## 2023-01-02 DIAGNOSIS — X32XXXD Exposure to sunlight, subsequent encounter: Secondary | ICD-10-CM | POA: Diagnosis not present

## 2023-01-02 DIAGNOSIS — L570 Actinic keratosis: Secondary | ICD-10-CM | POA: Diagnosis not present

## 2023-01-02 DIAGNOSIS — Z1283 Encounter for screening for malignant neoplasm of skin: Secondary | ICD-10-CM | POA: Diagnosis not present

## 2023-01-02 DIAGNOSIS — D225 Melanocytic nevi of trunk: Secondary | ICD-10-CM | POA: Diagnosis not present

## 2023-01-02 DIAGNOSIS — D485 Neoplasm of uncertain behavior of skin: Secondary | ICD-10-CM | POA: Diagnosis not present

## 2023-01-02 DIAGNOSIS — D2271 Melanocytic nevi of right lower limb, including hip: Secondary | ICD-10-CM | POA: Diagnosis not present

## 2023-01-19 DIAGNOSIS — D485 Neoplasm of uncertain behavior of skin: Secondary | ICD-10-CM | POA: Diagnosis not present

## 2023-01-19 DIAGNOSIS — L98499 Non-pressure chronic ulcer of skin of other sites with unspecified severity: Secondary | ICD-10-CM | POA: Diagnosis not present

## 2023-02-07 ENCOUNTER — Ambulatory Visit: Payer: BC Managed Care – PPO | Admitting: Family Medicine

## 2023-03-02 ENCOUNTER — Other Ambulatory Visit (HOSPITAL_BASED_OUTPATIENT_CLINIC_OR_DEPARTMENT_OTHER): Payer: Self-pay

## 2023-03-02 MED ORDER — COVID-19 MRNA VAC-TRIS(PFIZER) 30 MCG/0.3ML IM SUSY
0.3000 mL | PREFILLED_SYRINGE | Freq: Once | INTRAMUSCULAR | 0 refills | Status: AC
  Filled 2023-03-02: qty 0.3, 1d supply, fill #0

## 2023-03-02 MED ORDER — INFLUENZA VIRUS VACC SPLIT PF (FLUZONE) 0.5 ML IM SUSY
0.5000 mL | PREFILLED_SYRINGE | Freq: Once | INTRAMUSCULAR | 0 refills | Status: AC
  Filled 2023-03-02: qty 0.5, 1d supply, fill #0

## 2023-03-02 NOTE — Progress Notes (Deleted)
[facility] [address] [place] [address] Phone: [phone] Subjective:    I'm seeing this patient by the request  of:  Gweneth Dimitri, MD  CC:   [id number]  11/14/2022 Patient given injection today and tolerated the procedure well, discussed icing regimen and home exercises, discussed which activities to do and which ones to avoid.  Increase activity slowly over the course of next several weeks.  We discussed compression when needed.  Worsening symptoms could consider the possibility of advanced imaging to make sure there is no OCD that could be contributing.  Follow-up again in 2 months     Updated 03/06/2023 Thomas Stone is a 57 y.o. male coming in with complaint of L ankle pain       Past Medical History:  Diagnosis Date   ED (erectile dysfunction)    History of radiation therapy 08/19/09-10/06/09   prostate ,PSA 07/16/09=0.32   Hypertension    Prostate cancer (HCC) 2008 dx   T3aNO adenocarcinoma,PSA 28.7   Thrombocytosis 2007 dx   Past Surgical History:  Procedure Laterality Date   COLONOSCOPY     hx   FOREIGN BODY REMOVAL Right 01/06/2022   Procedure: right index excision foreign body granuloma with painful scar;  Surgeon: Betha Loa, MD;  Location: [facility];  Service: Orthopedics;  Laterality: Right;   KNEE ARTHROSCOPY Right 2020   [facility]   PROSTATE BIOPSY  02/17/2009   gleason 3+4=7(6/22)bxs+adenocarcinoma   PROSTATECTOMY  04/16/2009   robotic ,tumor right lobe   Social History   Socioeconomic History   Marital status: Married    Spouse name: Not on file   Number of children: Not on file   Years of education: Not on file   Highest education level: Not on file  Occupational History   Not on file  Tobacco Use   Smoking status: Never   Smokeless tobacco: Never  Vaping Use   Vaping status: Never Used  Substance and Sexual Activity   Alcohol use: Yes    Comment: weekly    Drug use: Never   Sexual activity: Not on file  Other Topics Concern   Not on file  Social History Narrative   Not on file   Social Determinants of Health   Financial Resource Strain: Not on file  Food Insecurity: Not on file  Transportation Needs: Not on file  Physical Activity: Not on file  Stress: Not on file  Social Connections: Not on file   Allergies  Allergen Reactions   Penicillins Other (See Comments)    "Old allergy" Was just a rash per patient report   Vancomycin Hives   Family History  Problem Relation Age of Onset   Prostate cancer Father        [id number],metastatic , also cabg/cad,   Melanoma Paternal Grandmother 13       died 27     Current Outpatient Medications (Cardiovascular):    atorvastatin (LIPITOR) 20 MG tablet, Take 20 mg by mouth daily.   losartan (COZAAR) 25 MG tablet, Take 25 mg by mouth daily.   metoprolol succinate (TOPROL-XL) 50 MG 24 hr tablet, Take 25 mg by mouth daily. Take with or immediately following a meal.   Current Outpatient Medications (Analgesics):    HYDROcodone-acetaminophen (NORCO/VICODIN) 5-325 MG tablet, 1-2 tabs PO q6 hours prn pain   Current Outpatient Medications (Other):    hydroxyurea (HYDREA) 500 MG capsule, May take with food to minimize GI side  effects. 1 on odd days\ 2 on even days   Reviewed prior external information including notes and imaging from  primary care provider As well as notes that were available from care everywhere and other healthcare systems.  Past medical history, social, surgical and family history all reviewed in electronic medical record.  No pertanent information unless stated regarding to the chief complaint.   Review of Systems:  No headache, visual changes, nausea, vomiting, diarrhea, constipation, dizziness, abdominal pain, skin rash, fevers, chills, night sweats, weight loss, swollen lymph nodes, body aches, joint swelling, chest pain, shortness of breath, mood changes.  POSITIVE muscle aches  Objective  There were no vitals taken for this visit.   General: No apparent distress alert and oriented x3 mood and affect normal, dressed appropriately.  HEENT: Pupils equal, extraocular movements intact  Respiratory: Patient's speak in full sentences and does not appear short of breath  Cardiovascular: No lower extremity edema, non tender, no erythema      Impression and Recommendations:

## 2023-03-06 ENCOUNTER — Ambulatory Visit: Payer: BC Managed Care – PPO | Admitting: Family Medicine

## 2023-03-08 NOTE — Progress Notes (Signed)
[facility] [address] [place] [address] Phone: [phone] Subjective:   Thomas Stone, am serving as a scribe for Dr. Antoine Stone.  I'm seeing this patient by the request  of:  Thomas Dimitri, MD  CC: Low back and coccyx pain  [id number]  11/14/2022 Patient given injection today and tolerated the procedure well, discussed icing regimen and home exercises, discussed which activities to do and which ones to avoid. Increase activity slowly over the course of next several weeks. We discussed compression when needed. Worsening symptoms could consider the possibility of advanced imaging to make sure there is no OCD that could be contributing. Follow-up again in 2 months   Update 03/09/2023 Thomas Stone is a 57 y.o. male coming in with complaint of L ankle pain. Patient states that his ankle is improving. Injection was helpful.   Also c/o pain in coccyx that continues to bother him. Pain at tip of bone with sitting intermittently. Pain in backside will subside when he releases pressure in that area. Denies nay radiating pain.        Past Medical History:  Diagnosis Date   ED (erectile dysfunction)    History of radiation therapy 08/19/09-10/06/09   prostate ,PSA 07/16/09=0.32   Hypertension    Prostate cancer (HCC) 2008 dx   T3aNO adenocarcinoma,PSA 28.7   Thrombocytosis 2007 dx   Past Surgical History:  Procedure Laterality Date   COLONOSCOPY     hx   FOREIGN BODY REMOVAL Right 01/06/2022   Procedure: right index excision foreign body granuloma with painful scar;  Surgeon: Betha Loa, MD;  Location: [facility];  Service: Orthopedics;  Laterality: Right;   KNEE ARTHROSCOPY Right 2020   [facility]   PROSTATE BIOPSY  02/17/2009   gleason 3+4=7(6/22)bxs+adenocarcinoma   PROSTATECTOMY  04/16/2009   robotic ,tumor right lobe   Social History   Socioeconomic History   Marital status: Married     Spouse name: Not on file   Number of children: Not on file   Years of education: Not on file   Highest education level: Not on file  Occupational History   Not on file  Tobacco Use   Smoking status: Never   Smokeless tobacco: Never  Vaping Use   Vaping status: Never Used  Substance and Sexual Activity   Alcohol use: Yes    Comment: weekly   Drug use: Never   Sexual activity: Not on file  Other Topics Concern   Not on file  Social History Narrative   Not on file   Social Determinants of Health   Financial Resource Strain: Not on file  Food Insecurity: Not on file  Transportation Needs: Not on file  Physical Activity: Not on file  Stress: Not on file  Social Connections: Not on file   Allergies  Allergen Reactions   Penicillins Other (See Comments)    "Old allergy" Was just a rash per patient report   Vancomycin Hives   Family History  Problem Relation Age of Onset   Prostate cancer Father        [id number],metastatic , also cabg/cad,   Melanoma Paternal Grandmother 96       died 56     Current Outpatient Medications (Cardiovascular):    atorvastatin (LIPITOR) 20 MG tablet, Take 20 mg by mouth daily.   losartan (COZAAR) 25 MG tablet, Take 25 mg by mouth daily.   metoprolol succinate (TOPROL-XL) 50 MG 24 hr  tablet, Take 25 mg by mouth daily. Take with or immediately following a meal.   Current Outpatient Medications (Analgesics):    HYDROcodone-acetaminophen (NORCO/VICODIN) 5-325 MG tablet, 1-2 tabs PO q6 hours prn pain   Current Outpatient Medications (Other):    hydroxyurea (HYDREA) 500 MG capsule, May take with food to minimize GI side effects. 1 on odd days\ 2 on even days   Reviewed prior external information including notes and imaging from  primary care provider As well as notes that were available from care everywhere and other healthcare systems.  Past medical history, social, surgical and family history all reviewed in electronic medical record.   No pertanent information unless stated regarding to the chief complaint.   Review of Systems:  No headache, visual changes, nausea, vomiting, diarrhea, constipation, dizziness, abdominal pain, skin rash, fevers, chills, night sweats, weight loss, swollen lymph nodes, body aches, joint swelling, chest pain, shortness of breath, mood changes. POSITIVE muscle aches  Objective  Blood pressure 122/74, pulse 68, height 5\' 11"  (1.803 m), weight 195 lb (88.5 kg), SpO2 98%.   General: No apparent distress alert and oriented x3 mood and affect normal, dressed appropriately.  HEENT: Pupils equal, extraocular movements intact  Respiratory: Patient's speak in full sentences and does not appear short of breath  Cardiovascular: No lower extremity edema, non tender, no erythema  Low back exam does have some loss of lordosis noted.  Some tenderness to palpation noted.  Patient is sitting uncomfortable.  Finds it difficult to find a comfortable position in the chair.    Impression and Recommendations:    The above documentation has been reviewed and is accurate and complete [name], DO

## 2023-03-09 ENCOUNTER — Encounter: Payer: Self-pay | Admitting: Family Medicine

## 2023-03-09 ENCOUNTER — Ambulatory Visit (INDEPENDENT_AMBULATORY_CARE_PROVIDER_SITE_OTHER): Payer: BC Managed Care – PPO | Admitting: Family Medicine

## 2023-03-09 ENCOUNTER — Other Ambulatory Visit: Payer: Self-pay

## 2023-03-09 VITALS — BP 122/74 | HR 68 | Ht 71.0 in | Wt 195.0 lb

## 2023-03-09 DIAGNOSIS — G8929 Other chronic pain: Secondary | ICD-10-CM

## 2023-03-09 DIAGNOSIS — M25572 Pain in left ankle and joints of left foot: Secondary | ICD-10-CM

## 2023-03-09 DIAGNOSIS — M2550 Pain in unspecified joint: Secondary | ICD-10-CM | POA: Diagnosis not present

## 2023-03-09 DIAGNOSIS — M533 Sacrococcygeal disorders, not elsewhere classified: Secondary | ICD-10-CM | POA: Diagnosis not present

## 2023-03-09 LAB — CBC WITH DIFFERENTIAL/PLATELET
Basophils Absolute: 0 10*3/uL (ref 0.0–0.1)
Basophils Relative: 0.8 % (ref 0.0–3.0)
Eosinophils Absolute: 0 10*3/uL (ref 0.0–0.7)
Eosinophils Relative: 1 % (ref 0.0–5.0)
HCT: 44.1 % (ref 39.0–52.0)
Hemoglobin: 15.1 g/dL (ref 13.0–17.0)
Lymphocytes Relative: 17.4 % (ref 12.0–46.0)
Lymphs Abs: 0.9 10*3/uL (ref 0.7–4.0)
MCHC: 34.3 g/dL (ref 30.0–36.0)
MCV: 103 fL — ABNORMAL HIGH (ref 78.0–100.0)
Monocytes Absolute: 0.3 10*3/uL (ref 0.1–1.0)
Monocytes Relative: 5.3 % (ref 3.0–12.0)
Neutro Abs: 3.9 10*3/uL (ref 1.4–7.7)
Neutrophils Relative %: 75.5 % (ref 43.0–77.0)
Platelets: 413 10*3/uL — ABNORMAL HIGH (ref 150.0–400.0)
RBC: 4.28 Mil/uL (ref 4.22–5.81)
RDW: 12.8 % (ref 11.5–15.5)
WBC: 5.2 10*3/uL (ref 4.0–10.5)

## 2023-03-09 LAB — COMPREHENSIVE METABOLIC PANEL
ALT: 19 U/L (ref 0–53)
AST: 16 U/L (ref 0–37)
Albumin: 4.4 g/dL (ref 3.5–5.2)
Alkaline Phosphatase: 30 U/L — ABNORMAL LOW (ref 39–117)
BUN: 9 mg/dL (ref 6–23)
CO2: 29 meq/L (ref 19–32)
Calcium: 9.2 mg/dL (ref 8.4–10.5)
Chloride: 103 meq/L (ref 96–112)
Creatinine, Ser: 0.98 mg/dL (ref 0.40–1.50)
GFR: 86.53 mL/min (ref >60.00)
Glucose, Bld: 85 mg/dL (ref 70–99)
Potassium: 4.3 meq/L (ref 3.5–5.1)
Sodium: 140 meq/L (ref 135–145)
Total Bilirubin: 0.6 mg/dL (ref 0.2–1.2)
Total Protein: 6.4 g/dL (ref 6.0–8.3)

## 2023-03-09 LAB — PSA: PSA: 0 ng/mL — ABNORMAL LOW (ref 0.10–4.00)

## 2023-03-09 LAB — SEDIMENTATION RATE: Sed Rate: 8 mm/h (ref 0–20)

## 2023-03-09 NOTE — Assessment & Plan Note (Signed)
Nontraumatic coccydynia that seems to be worsening at this time.  Past medical history significant for prostate cancer.  Pain is affecting daily activities, difficulty to sit or even sleep sometimes.  Likely no radicular symptoms.  Unfortunately with the worsening symptoms I do think advanced imaging is warranted.  Follow-up with me again after imaging to discuss further.

## 2023-03-09 NOTE — Patient Instructions (Addendum)
[facility] [phone] Call Today  When we receive your results we will contact you.  Dr. Launa Grill [address]

## 2023-03-11 ENCOUNTER — Other Ambulatory Visit: Payer: BC Managed Care – PPO

## 2023-03-12 DIAGNOSIS — Q72812 Congenital shortening of left lower limb: Secondary | ICD-10-CM | POA: Diagnosis not present

## 2023-03-12 DIAGNOSIS — M9903 Segmental and somatic dysfunction of lumbar region: Secondary | ICD-10-CM | POA: Diagnosis not present

## 2023-03-12 DIAGNOSIS — M9905 Segmental and somatic dysfunction of pelvic region: Secondary | ICD-10-CM | POA: Diagnosis not present

## 2023-03-12 DIAGNOSIS — M25551 Pain in right hip: Secondary | ICD-10-CM | POA: Diagnosis not present

## 2023-03-13 ENCOUNTER — Encounter: Payer: Self-pay | Admitting: Family Medicine

## 2023-04-15 ENCOUNTER — Ambulatory Visit
Admission: RE | Admit: 2023-04-15 | Discharge: 2023-04-15 | Disposition: A | Discharge status: Home / Self Care | Payer: BC Managed Care – PPO | Source: Ambulatory Visit | Attending: Family Medicine | Admitting: Family Medicine

## 2023-04-15 DIAGNOSIS — M533 Sacrococcygeal disorders, not elsewhere classified: Secondary | ICD-10-CM | POA: Diagnosis not present

## 2023-04-15 MED ORDER — GADOPICLENOL 0.5 MMOL/ML IV SOLN
9.0000 mL | Freq: Once | INTRAVENOUS | Status: AC | PRN
  Administered 2023-04-15: 9 mL via INTRAVENOUS

## 2023-04-20 ENCOUNTER — Encounter: Payer: Self-pay | Admitting: Family Medicine

## 2023-04-23 DIAGNOSIS — J029 Acute pharyngitis, unspecified: Secondary | ICD-10-CM | POA: Diagnosis not present

## 2023-04-23 DIAGNOSIS — Z6826 Body mass index (BMI) 26.0-26.9, adult: Secondary | ICD-10-CM | POA: Diagnosis not present

## 2023-04-26 DIAGNOSIS — N529 Male erectile dysfunction, unspecified: Secondary | ICD-10-CM | POA: Diagnosis not present

## 2023-04-26 DIAGNOSIS — C61 Malignant neoplasm of prostate: Secondary | ICD-10-CM | POA: Diagnosis not present

## 2023-04-27 DIAGNOSIS — J0100 Acute maxillary sinusitis, unspecified: Secondary | ICD-10-CM | POA: Diagnosis not present

## 2023-04-27 DIAGNOSIS — J029 Acute pharyngitis, unspecified: Secondary | ICD-10-CM | POA: Diagnosis not present

## 2023-05-08 NOTE — Progress Notes (Unsigned)
[facility] [address] [place] [address] Phone: [phone] Subjective:   Thomas Stone, am serving as a scribe for Dr. Antoine Primas.  I'm seeing this patient by the request  of:  Gweneth Dimitri, MD  CC: Low back pain  [id number]  03/09/2023 Nontraumatic coccydynia that seems to be worsening at this time.  Past medical history significant for prostate cancer.  Pain is affecting daily activities, difficulty to sit or even sleep sometimes.  Likely no radicular symptoms.  Unfortunately with the worsening symptoms I do think advanced imaging is warranted.  Follow-up with me again after imaging to discuss further.      Update 05/09/2023 Thomas Stone is a 57 y.o. male coming in with complaint of coccyx pain. Patient states the same as last visit. Here for MRI results.  Low back pain is still there.  Seems to be only on the coccyx and only when he is sitting.  States that the pain can be fairly severe but not enough to take anything.     Past Medical History:  Diagnosis Date   ED (erectile dysfunction)    History of radiation therapy 08/19/09-10/06/09   prostate ,PSA 07/16/09=0.32   Hypertension    Prostate cancer (HCC) 2008 dx   T3aNO adenocarcinoma,PSA 28.7   Thrombocytosis 2007 dx   Past Surgical History:  Procedure Laterality Date   COLONOSCOPY     hx   FOREIGN BODY REMOVAL Right 01/06/2022   Procedure: right index excision foreign body granuloma with painful scar;  Surgeon: Betha Loa, MD;  Location: [facility];  Service: Orthopedics;  Laterality: Right;   KNEE ARTHROSCOPY Right 2020   [facility]   PROSTATE BIOPSY  02/17/2009   gleason 3+4=7(6/22)bxs+adenocarcinoma   PROSTATECTOMY  04/16/2009   robotic ,tumor right lobe   Social History   Socioeconomic History   Marital status: Married    Spouse name: Not on file   Number of children: Not on file   Years of education: Not on file    Highest education level: Not on file  Occupational History   Not on file  Tobacco Use   Smoking status: Never   Smokeless tobacco: Never  Vaping Use   Vaping status: Never Used  Substance and Sexual Activity   Alcohol use: Yes    Comment: weekly   Drug use: Never   Sexual activity: Not on file  Other Topics Concern   Not on file  Social History Narrative   Not on file   Social Determinants of Health   Financial Resource Strain: Not on file  Food Insecurity: Low Risk  (04/26/2023)   Received from [facility]   Hunger Vital Sign    Worried About Running Out of Food in the Last Year: Never true    Ran Out of Food in the Last Year: Never true  Transportation Needs: No Transportation Needs (04/26/2023)   Received from [facility]    In the past 12 months, has lack of reliable transportation kept you from medical appointments, meetings, work or from getting things needed for daily living? : No  Physical Activity: Not on file  Stress: Not on file  Social Connections: Not on file   Allergies  Allergen Reactions   Penicillins Other (See Comments)    "Old allergy" Was just a rash per patient report   Vancomycin Hives   Family History  Problem Relation Age of Onset  Prostate cancer Father        [id number],metastatic , also cabg/cad,   Melanoma Paternal Grandmother 12       died 65     Current Outpatient Medications (Cardiovascular):    atorvastatin (LIPITOR) 20 MG tablet, Take 20 mg by mouth daily.   losartan (COZAAR) 25 MG tablet, Take 25 mg by mouth daily.   metoprolol succinate (TOPROL-XL) 50 MG 24 hr tablet, Take 25 mg by mouth daily. Take with or immediately following a meal.   Current Outpatient Medications (Analgesics):    colchicine 0.6 MG tablet, Take 1 tablet (0.6 mg total) by mouth 2 (two) times daily.   HYDROcodone-acetaminophen (NORCO/VICODIN) 5-325 MG tablet, 1-2 tabs PO q6 hours prn pain   meloxicam (MOBIC) 15 MG tablet, Take 1  tablet (15 mg total) by mouth daily.   Current Outpatient Medications (Other):    gabapentin (NEURONTIN) 100 MG capsule, Take 2 capsules (200 mg total) by mouth at bedtime.   hydroxyurea (HYDREA) 500 MG capsule, May take with food to minimize GI side effects. 1 on odd days\ 2 on even days   Reviewed prior external information including notes and imaging from  primary care provider As well as notes that were available from care everywhere and other healthcare systems.  Past medical history, social, surgical and family history all reviewed in electronic medical record.  No pertanent information unless stated regarding to the chief complaint.   Review of Systems:  No headache, visual changes, nausea, vomiting, diarrhea, constipation, dizziness, abdominal pain, skin rash, fevers, chills, night sweats, weight loss, swollen lymph nodes, body aches, joint swelling, chest pain, shortness of breath, mood changes. POSITIVE muscle aches  Objective  Blood pressure 124/70, pulse 77, height 5\' 11"  (1.803 m), weight 197 lb (89.4 kg), SpO2 97%.   General: No apparent distress alert and oriented x3 mood and affect normal, dressed appropriately.  HEENT: Pupils equal, extraocular movements intact  Respiratory: Patient's speak in full sentences and does not appear short of breath  Cardiovascular: No lower extremity edema, non tender, no erythema  Patient is tender only over the coccyx on the midline itself.  No skin breakdown noted, no sign of any infectious etiology.  Minimal discomfort in the soft tissue.    Impression and Recommendations:    The above documentation has been reviewed and is accurate and complete [name], DO

## 2023-05-09 ENCOUNTER — Ambulatory Visit: Payer: BC Managed Care – PPO | Admitting: Family Medicine

## 2023-05-09 ENCOUNTER — Encounter: Payer: Self-pay | Admitting: Family Medicine

## 2023-05-09 VITALS — BP 124/70 | HR 77 | Ht 71.0 in | Wt 197.0 lb

## 2023-05-09 DIAGNOSIS — C61 Malignant neoplasm of prostate: Secondary | ICD-10-CM | POA: Diagnosis not present

## 2023-05-09 DIAGNOSIS — M533 Sacrococcygeal disorders, not elsewhere classified: Secondary | ICD-10-CM

## 2023-05-09 MED ORDER — GABAPENTIN 100 MG PO CAPS: 200.0000 mg | ORAL_CAPSULE | Freq: Every day | ORAL | 0 refills | Status: AC

## 2023-05-09 MED ORDER — COLCHICINE 0.6 MG PO TABS: 0.6000 mg | ORAL_TABLET | Freq: Two times a day (BID) | ORAL | 0 refills | Status: AC

## 2023-05-09 MED ORDER — MELOXICAM 15 MG PO TABS: 15.0000 mg | ORAL_TABLET | Freq: Every day | ORAL | 0 refills | Status: AC

## 2023-05-09 NOTE — Assessment & Plan Note (Addendum)
Patient's MRI of the pelvis area did not show any significant findings.  Continues to have the pain.  It is aggravating but not completely debilitating.  Will try a 5-day burst of colchicine.  If that does not seem to help then we will move onto meloxicam.  Given gabapentin in case there is some nerve impingement that could be potentially contributing as well.  Discussed icing regimen and home exercises otherwise.  Increase activity slowly.  Pay attention to any other symptoms that can be occurring and follow-up with me again in 2 months  Colchicine 2x a day for 5 days If not better, start gabapentin 200mg  at night  Meloxicam 15mg  for 10 days Send message in 2 weeks  Happy Holidays

## 2023-05-09 NOTE — Assessment & Plan Note (Signed)
PSA is completely 0 and MRI is completely negative

## 2023-05-09 NOTE — Patient Instructions (Addendum)
Colchicine 2x a day for 5 days If not better, start gabapentin 200mg  at night  Meloxicam 15mg  for 10 days Send message in 2 weeks  Happy Holidays

## 2023-06-21 ENCOUNTER — Ambulatory Visit: Payer: BC Managed Care – PPO | Admitting: Family Medicine

## 2023-06-23 DIAGNOSIS — R509 Fever, unspecified: Secondary | ICD-10-CM | POA: Diagnosis not present

## 2023-06-23 DIAGNOSIS — Z03818 Encounter for observation for suspected exposure to other biological agents ruled out: Secondary | ICD-10-CM | POA: Diagnosis not present

## 2023-06-23 DIAGNOSIS — J101 Influenza due to other identified influenza virus with other respiratory manifestations: Secondary | ICD-10-CM | POA: Diagnosis not present

## 2023-06-23 DIAGNOSIS — R051 Acute cough: Secondary | ICD-10-CM | POA: Diagnosis not present

## 2023-07-02 DIAGNOSIS — D3132 Benign neoplasm of left choroid: Secondary | ICD-10-CM | POA: Diagnosis not present

## 2023-07-02 DIAGNOSIS — H43813 Vitreous degeneration, bilateral: Secondary | ICD-10-CM | POA: Diagnosis not present

## 2023-07-02 DIAGNOSIS — H35362 Drusen (degenerative) of macula, left eye: Secondary | ICD-10-CM | POA: Diagnosis not present

## 2023-07-08 DIAGNOSIS — M7711 Lateral epicondylitis, right elbow: Secondary | ICD-10-CM | POA: Diagnosis not present

## 2023-07-16 DIAGNOSIS — D473 Essential (hemorrhagic) thrombocythemia: Secondary | ICD-10-CM | POA: Diagnosis not present

## 2023-07-16 DIAGNOSIS — E7800 Pure hypercholesterolemia, unspecified: Secondary | ICD-10-CM | POA: Diagnosis not present

## 2023-07-16 DIAGNOSIS — I10 Essential (primary) hypertension: Secondary | ICD-10-CM | POA: Diagnosis not present

## 2023-07-19 DIAGNOSIS — Z0000 Encounter for general adult medical examination without abnormal findings: Secondary | ICD-10-CM | POA: Diagnosis not present

## 2023-07-19 DIAGNOSIS — E7800 Pure hypercholesterolemia, unspecified: Secondary | ICD-10-CM | POA: Diagnosis not present

## 2023-07-19 DIAGNOSIS — I10 Essential (primary) hypertension: Secondary | ICD-10-CM | POA: Diagnosis not present

## 2023-07-19 DIAGNOSIS — D473 Essential (hemorrhagic) thrombocythemia: Secondary | ICD-10-CM | POA: Diagnosis not present

## 2023-07-19 DIAGNOSIS — R7301 Impaired fasting glucose: Secondary | ICD-10-CM | POA: Diagnosis not present

## 2023-07-19 DIAGNOSIS — Z1159 Encounter for screening for other viral diseases: Secondary | ICD-10-CM | POA: Diagnosis not present

## 2023-08-03 DIAGNOSIS — M7711 Lateral epicondylitis, right elbow: Secondary | ICD-10-CM | POA: Diagnosis not present

## 2023-08-31 DIAGNOSIS — Z1159 Encounter for screening for other viral diseases: Secondary | ICD-10-CM | POA: Diagnosis not present

## 2023-08-31 DIAGNOSIS — Z23 Encounter for immunization: Secondary | ICD-10-CM | POA: Diagnosis not present

## 2023-09-28 DIAGNOSIS — Z23 Encounter for immunization: Secondary | ICD-10-CM | POA: Diagnosis not present

## 2023-10-01 ENCOUNTER — Encounter: Payer: Self-pay | Admitting: Family Medicine

## 2023-10-01 NOTE — Telephone Encounter (Signed)
 Called patient and schedule appointment for 11/14/23 at 10:15. Patient confirmed that this time works for him.

## 2023-11-13 NOTE — Progress Notes (Unsigned)
 [facility] [address] [place] [address] Phone: [phone] Subjective:   Delwyn Filippo, am serving as a scribe for Dr. Ronnell Coins.  I'm seeing this patient by the request  of:  Helyn Lobstein, MD  CC: left ankle pain   [id number]  Thomas Stone is a 58 y.o. male coming in with complaint of L ankle pain. Injection in L ankle 11/14/2022. Patient states that his pain is chronic and he is about to go out of country.        Past Medical History:  Diagnosis Date   ED (erectile dysfunction)    History of radiation therapy 08/19/09-10/06/09   prostate ,PSA 07/16/09=0.32   Hypertension    Prostate cancer (HCC) 2008 dx   T3aNO adenocarcinoma,PSA 28.7   Thrombocytosis 2007 dx   Past Surgical History:  Procedure Laterality Date   COLONOSCOPY     hx   FOREIGN BODY REMOVAL Right 01/06/2022   Procedure: right index excision foreign body granuloma with painful scar;  Surgeon: Brunilda Capra, MD;  Location: [facility];  Service: Orthopedics;  Laterality: Right;   KNEE ARTHROSCOPY Right 2020   [facility]   PROSTATE BIOPSY  02/17/2009   gleason 3+4=7(6/22)bxs+adenocarcinoma   PROSTATECTOMY  04/16/2009   robotic ,tumor right lobe   Social History   Socioeconomic History   Marital status: Married    Spouse name: Not on file   Number of children: Not on file   Years of education: Not on file   Highest education level: Not on file  Occupational History   Not on file  Tobacco Use   Smoking status: Never   Smokeless tobacco: Never  Vaping Use   Vaping status: Never Used  Substance and Sexual Activity   Alcohol use: Yes    Comment: weekly   Drug use: Never   Sexual activity: Not on file  Other Topics Concern   Not on file  Social History Narrative   Not on file   Social Drivers of Health   Financial Resource Strain: Not on file  Food Insecurity: Low Risk  (04/26/2023)   Received from [facility]   Hunger Vital Sign    Worried About Running Out of Food in the Last Year: Never true    Ran Out of Food in the Last Year: Never true  Transportation Needs: No Transportation Needs (04/26/2023)   Received from [facility]    In the past 12 months, has lack of reliable transportation kept you from medical appointments, meetings, work or from getting things needed for daily living? : No  Physical Activity: Not on file  Stress: Not on file  Social Connections: Not on file   Allergies  Allergen Reactions   Penicillins Other (See Comments)    Old allergy Was just a rash per patient report   Vancomycin  Hives   Family History  Problem Relation Age of Onset   Prostate cancer Father        [id number],metastatic , also cabg/cad,   Melanoma Paternal Grandmother 39       died 76     Current Outpatient Medications (Cardiovascular):    atorvastatin (LIPITOR) 20 MG tablet, Take 20 mg by mouth daily.   losartan (COZAAR) 25 MG tablet, Take 25 mg by mouth daily.   metoprolol succinate (TOPROL-XL) 50 MG 24 hr tablet, Take 25 mg by mouth daily. Take with or immediately following a meal.  Current Outpatient Medications (Analgesics):    colchicine  0.6 MG tablet, Take 1 tablet (0.6 mg total) by mouth 2 (two) times daily.   HYDROcodone -acetaminophen  (NORCO/VICODIN) 5-325 MG tablet, 1-2 tabs PO q6 hours prn pain   meloxicam  (MOBIC ) 15 MG tablet, Take 1 tablet (15 mg total) by mouth daily.   Current Outpatient Medications (Other):    gabapentin  (NEURONTIN ) 100 MG capsule, Take 2 capsules (200 mg total) by mouth at bedtime.   hydroxyurea  (HYDREA ) 500 MG capsule, May take with food to minimize GI side effects. 1 on odd days\ 2 on even days   Reviewed prior external information including notes and imaging from  primary care provider As well as notes that were available from care everywhere and other healthcare systems.  Past medical history, social, surgical and  family history all reviewed in electronic medical record.  No pertanent information unless stated regarding to the chief complaint.   Review of Systems:  No headache, visual changes, nausea, vomiting, diarrhea, constipation, dizziness, abdominal pain, skin rash, fevers, chills, night sweats, weight loss, swollen lymph nodes, body aches, joint swelling, chest pain, shortness of breath, mood changes. POSITIVE muscle aches  Objective  There were no vitals taken for this visit.   General: No apparent distress alert and oriented x3 mood and affect normal, dressed appropriately.  HEENT: Pupils equal, extraocular movements intact  Respiratory: Patient's speak in full sentences and does not appear short of breath  Cardiovascular: No lower extremity edema, non tender, no erythema  Ankle exam shows tenderness to palpation over the ATFL.  Procedure: Real-time Ultrasound Guided Injection of   Left ankle mortise laterally Device: GE Logiq Q7 Ultrasound guided injection is preferred based studies that show increased duration, increased effect, greater accuracy, decreased procedural pain, increased response rate, and decreased cost with ultrasound guided versus blind injection.  Verbal informed consent obtained.  Time-out conducted.  Noted no overlying erythema, induration, or other signs of local infection.  Skin prepped in a sterile fashion.  Local anesthesia: Topical Ethyl chloride.  With sterile technique and under real time ultrasound guidance: With a 25-gauge 1 inch needle injected into the ankle mortise on the lateral aspect with a total of 0.5 cc of 0.5% Marcaine  and 0.5 cc of Kenalog  40 mg/mL. Completed without difficulty  Pain immediately resolved suggesting accurate placement of the medication.  Advised to call if fevers/chills, erythema, induration, drainage, or persistent bleeding.  Images saved Impression: Technically successful ultrasound guided injection.    Impression and  Recommendations:     The above documentation has been reviewed and is accurate and complete Unnamed Hino M Siomara Burkel, DO

## 2023-11-14 ENCOUNTER — Encounter: Payer: Self-pay | Admitting: Family Medicine

## 2023-11-14 ENCOUNTER — Ambulatory Visit: Admitting: Family Medicine

## 2023-11-14 ENCOUNTER — Other Ambulatory Visit: Payer: Self-pay

## 2023-11-14 VITALS — BP 124/82 | HR 73 | Ht 71.0 in | Wt 202.0 lb

## 2023-11-14 DIAGNOSIS — G8929 Other chronic pain: Secondary | ICD-10-CM | POA: Diagnosis not present

## 2023-11-14 DIAGNOSIS — M25572 Pain in left ankle and joints of left foot: Secondary | ICD-10-CM | POA: Diagnosis not present

## 2023-11-14 MED ORDER — PREDNISONE 20 MG PO TABS: 20.0000 mg | ORAL_TABLET | Freq: Every day | ORAL | 0 refills | Status: AC

## 2023-11-14 NOTE — Assessment & Plan Note (Signed)
 1 year since patient has had injection.  Has tolerated very well.  Given another injection and hopefully will make this significant improvement with patient going on a trip.  Prednisone  20 mg given as well for any breakthrough pain.  Discussed icing regimen and home exercises.  Follow-up again in 6 to 8 weeks

## 2023-11-14 NOTE — Patient Instructions (Addendum)
 Injected ankle today Prednisone  20mg  for 10 days

## 2024-04-02 DIAGNOSIS — L570 Actinic keratosis: Secondary | ICD-10-CM | POA: Diagnosis not present

## 2024-04-02 DIAGNOSIS — Z1283 Encounter for screening for malignant neoplasm of skin: Secondary | ICD-10-CM | POA: Diagnosis not present

## 2024-04-02 DIAGNOSIS — D225 Melanocytic nevi of trunk: Secondary | ICD-10-CM | POA: Diagnosis not present

## 2024-04-02 DIAGNOSIS — L821 Other seborrheic keratosis: Secondary | ICD-10-CM | POA: Diagnosis not present

## 2024-04-02 DIAGNOSIS — X32XXXD Exposure to sunlight, subsequent encounter: Secondary | ICD-10-CM | POA: Diagnosis not present

## 2024-05-05 DIAGNOSIS — N529 Male erectile dysfunction, unspecified: Secondary | ICD-10-CM | POA: Diagnosis not present

## 2024-05-05 DIAGNOSIS — C61 Malignant neoplasm of prostate: Secondary | ICD-10-CM | POA: Diagnosis not present

## 2024-05-08 DIAGNOSIS — N529 Male erectile dysfunction, unspecified: Secondary | ICD-10-CM | POA: Diagnosis not present

## 2024-05-08 DIAGNOSIS — C61 Malignant neoplasm of prostate: Secondary | ICD-10-CM | POA: Diagnosis not present

## 2024-05-13 ENCOUNTER — Ambulatory Visit: Payer: Self-pay | Admitting: Plastic Surgery

## 2024-05-13 ENCOUNTER — Encounter: Payer: Self-pay | Admitting: Plastic Surgery

## 2024-05-13 VITALS — BP 150/87 | HR 73

## 2024-05-13 DIAGNOSIS — L989 Disorder of the skin and subcutaneous tissue, unspecified: Secondary | ICD-10-CM | POA: Diagnosis not present

## 2024-05-13 DIAGNOSIS — Z719 Counseling, unspecified: Secondary | ICD-10-CM | POA: Insufficient documentation

## 2024-05-13 NOTE — Progress Notes (Signed)
 Patient ID: Thomas Stone, male    DOB: Jan 24, 1966, 58 y.o.   MRN: [id number]   Chief Complaint  Patient presents with   Advice Only   Skin Problem    The patient is a 58 year old male here for evaluation of his scalp.  He has had skin lesions removed in the past.  His dermatologist is Dr. Shona.  He has used a cream in the past on his forehead but nothing happened.  That was with a different dermatologist.  He recently had several areas frozen off of his scalp.  He wants to know if there is anything he could do to get it looking a little bit better.  He is concerned about getting a bunch of seborrheic keratoses in the future.  He does have some now in the right scalp and right ear.    Review of Systems  Constitutional: Negative.   Eyes: Negative.   Respiratory: Negative.    Cardiovascular: Negative.   Gastrointestinal: Negative.   Genitourinary: Negative.   Musculoskeletal: Negative.     Past Medical History:  Diagnosis Date   ED (erectile dysfunction)    History of radiation therapy 08/19/09-10/06/09   prostate ,PSA 07/16/09=0.32   Hypertension    Prostate cancer (HCC) 2008 dx   T3aNO adenocarcinoma,PSA 28.7   Thrombocytosis 2007 dx    Past Surgical History:  Procedure Laterality Date   COLONOSCOPY     hx   FOREIGN BODY REMOVAL Right 01/06/2022   Procedure: right index excision foreign body granuloma with painful scar;  Surgeon: Thomas Drivers, MD;  Location: [facility];  Service: Orthopedics;  Laterality: Right;   KNEE ARTHROSCOPY Right 2020   [facility]   PROSTATE BIOPSY  02/17/2009   gleason 3+4=7(6/22)bxs+adenocarcinoma   PROSTATECTOMY  04/16/2009   robotic ,tumor right lobe      Current Outpatient Medications:    atorvastatin (LIPITOR) 20 MG tablet, Take 20 mg by mouth daily., Disp: , Rfl:    hydroxyurea  (HYDREA ) 500 MG capsule, May take with food to minimize GI side effects. 1 on odd days\ 2 on even days, Disp: 140 capsule,  Rfl: 6   losartan (COZAAR) 25 MG tablet, Take 25 mg by mouth daily., Disp: , Rfl:    metoprolol succinate (TOPROL-XL) 50 MG 24 hr tablet, Take 25 mg by mouth daily. Take with or immediately following a meal., Disp: , Rfl:    colchicine  0.6 MG tablet, Take 1 tablet (0.6 mg total) by mouth 2 (two) times daily. (Patient not taking: Reported on 05/13/2024), Disp: 10 tablet, Rfl: 0   gabapentin  (NEURONTIN ) 100 MG capsule, Take 2 capsules (200 mg total) by mouth at bedtime. (Patient not taking: Reported on 05/13/2024), Disp: 180 capsule, Rfl: 0   HYDROcodone -acetaminophen  (NORCO/VICODIN) 5-325 MG tablet, 1-2 tabs PO q6 hours prn pain (Patient not taking: Reported on 05/13/2024), Disp: 15 tablet, Rfl: 0   meloxicam  (MOBIC ) 15 MG tablet, Take 1 tablet (15 mg total) by mouth daily. (Patient not taking: Reported on 05/13/2024), Disp: 30 tablet, Rfl: 0   predniSONE  (DELTASONE ) 20 MG tablet, Take 1 tablet (20 mg total) by mouth daily with breakfast. (Patient not taking: Reported on 05/13/2024), Disp: 10 tablet, Rfl: 0   Objective:   Vitals:   05/13/24 1404  BP: (!) 150/87  Pulse: 73  SpO2: 99%    Physical Exam Vitals reviewed.  Constitutional:      Appearance: Normal appearance.  HENT:     Head: Atraumatic.  Skin:    General: Skin is warm.     Coloration: Skin is not jaundiced.     Findings: Lesion present. No bruising.  Neurological:     Mental Status: He is alert and oriented to person, place, and time.  Psychiatric:        Mood and Affect: Mood normal.        Behavior: Behavior normal.        Thought Content: Thought content normal.        Judgment: Judgment normal.     Assessment & Plan:  Encounter for counseling  I would like to get the patient into see Thomas Stone.  I also recommend the cyst amine cream.  This would be something he would do 15 minutes a day for 3 days a week.  He is also a good candidate for BBL on the face and a Moxi on the forehead and scalp.  He is going to think that  through.  If he wants we can do TRL to the seborrheic keratosis in his ear and on his right scalp.  Pictures were obtained of the patient and placed in the chart with the patient's or guardian's permission.   Thomas RAMAN Abriel Hattery, DO

## 2024-05-26 ENCOUNTER — Other Ambulatory Visit (HOSPITAL_BASED_OUTPATIENT_CLINIC_OR_DEPARTMENT_OTHER): Payer: Self-pay

## 2024-05-26 MED ORDER — COMIRNATY 30 MCG/0.3ML IM SUSY
0.3000 mL | PREFILLED_SYRINGE | Freq: Once | INTRAMUSCULAR | 0 refills | Status: AC
  Filled 2024-05-26: qty 0.3, 1d supply, fill #0
